# Patient Record
Sex: Female | Born: 1995 | Hispanic: Yes | Marital: Single | Smoking: Never smoker
Health system: Southern US, Community
[De-identification: ages and names within clinical notes are randomized; demographics above are authoritative.]

## PROBLEM LIST (undated history)

## (undated) ENCOUNTER — Ambulatory Visit: Payer: Self-pay | Source: Home / Self Care

## (undated) DIAGNOSIS — T7840XA Allergy, unspecified, initial encounter: Secondary | ICD-10-CM

## (undated) DIAGNOSIS — K37 Unspecified appendicitis: Secondary | ICD-10-CM

## (undated) DIAGNOSIS — R102 Pelvic and perineal pain unspecified side: Secondary | ICD-10-CM

## (undated) DIAGNOSIS — N809 Endometriosis, unspecified: Secondary | ICD-10-CM

## (undated) DIAGNOSIS — K219 Gastro-esophageal reflux disease without esophagitis: Secondary | ICD-10-CM

## (undated) DIAGNOSIS — K5900 Constipation, unspecified: Secondary | ICD-10-CM

## (undated) HISTORY — DX: Unspecified appendicitis: K37

## (undated) HISTORY — PX: APPENDECTOMY: SHX54

## (undated) HISTORY — DX: Allergy, unspecified, initial encounter: T78.40XA

## (undated) HISTORY — DX: Constipation, unspecified: K59.00

## (undated) HISTORY — DX: Endometriosis, unspecified: N80.9

## (undated) HISTORY — DX: Gastro-esophageal reflux disease without esophagitis: K21.9

## (undated) HISTORY — DX: Pelvic and perineal pain: R10.2

## (undated) HISTORY — DX: Pelvic and perineal pain unspecified side: R10.20

---
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
## 2014-07-06 ENCOUNTER — Ambulatory Visit: Payer: 59 | Admitting: Physician Assistant

## 2014-10-02 ENCOUNTER — Ambulatory Visit (INDEPENDENT_AMBULATORY_CARE_PROVIDER_SITE_OTHER): Payer: 59 | Admitting: Physician Assistant

## 2014-10-02 ENCOUNTER — Encounter: Payer: Self-pay | Admitting: Physician Assistant

## 2014-10-02 VITALS — BP 109/72 | HR 88 | Wt 135.0 lb

## 2014-10-02 DIAGNOSIS — R1084 Generalized abdominal pain: Secondary | ICD-10-CM

## 2014-10-02 DIAGNOSIS — N949 Unspecified condition associated with female genital organs and menstrual cycle: Secondary | ICD-10-CM | POA: Diagnosis not present

## 2014-10-02 DIAGNOSIS — R1032 Left lower quadrant pain: Secondary | ICD-10-CM

## 2014-10-02 DIAGNOSIS — R102 Pelvic and perineal pain: Secondary | ICD-10-CM | POA: Insufficient documentation

## 2014-10-02 DIAGNOSIS — K5900 Constipation, unspecified: Secondary | ICD-10-CM | POA: Diagnosis not present

## 2014-10-02 DIAGNOSIS — R198 Other specified symptoms and signs involving the digestive system and abdomen: Secondary | ICD-10-CM | POA: Insufficient documentation

## 2014-10-02 DIAGNOSIS — N898 Other specified noninflammatory disorders of vagina: Secondary | ICD-10-CM | POA: Diagnosis not present

## 2014-10-02 DIAGNOSIS — K5909 Other constipation: Secondary | ICD-10-CM

## 2014-10-02 DIAGNOSIS — Z7251 High risk heterosexual behavior: Secondary | ICD-10-CM

## 2014-10-02 LAB — CBC WITH DIFFERENTIAL/PLATELET
BASOS PCT: 0 % (ref 0–1)
Basophils Absolute: 0 10*3/uL (ref 0.0–0.1)
EOS ABS: 0.1 10*3/uL (ref 0.0–0.7)
Eosinophils Relative: 1 % (ref 0–5)
HCT: 39.4 % (ref 36.0–46.0)
Hemoglobin: 13.3 g/dL (ref 12.0–15.0)
Lymphocytes Relative: 23 % (ref 12–46)
Lymphs Abs: 3 10*3/uL (ref 0.7–4.0)
MCH: 29.4 pg (ref 26.0–34.0)
MCHC: 33.8 g/dL (ref 30.0–36.0)
MCV: 87.2 fL (ref 78.0–100.0)
MONO ABS: 0.8 10*3/uL (ref 0.1–1.0)
MONOS PCT: 6 % (ref 3–12)
MPV: 9.9 fL (ref 8.6–12.4)
NEUTROS ABS: 9.1 10*3/uL — AB (ref 1.7–7.7)
Neutrophils Relative %: 70 % (ref 43–77)
Platelets: 234 10*3/uL (ref 150–400)
RBC: 4.52 MIL/uL (ref 3.87–5.11)
RDW: 12.6 % (ref 11.5–15.5)
WBC: 13 10*3/uL — ABNORMAL HIGH (ref 4.0–10.5)

## 2014-10-02 LAB — LIPASE: Lipase: 32 U/L (ref 0–75)

## 2014-10-02 LAB — COMPLETE METABOLIC PANEL WITH GFR
ALBUMIN: 4.2 g/dL (ref 3.5–5.2)
ALK PHOS: 90 U/L (ref 39–117)
ALT: 16 U/L (ref 0–35)
AST: 17 U/L (ref 0–37)
BUN: 14 mg/dL (ref 6–23)
CO2: 24 mEq/L (ref 19–32)
Calcium: 9.4 mg/dL (ref 8.4–10.5)
Chloride: 105 mEq/L (ref 96–112)
Creat: 0.76 mg/dL (ref 0.50–1.10)
GFR, Est African American: >89 mL/min
Glucose, Bld: 105 mg/dL — ABNORMAL HIGH (ref 70–99)
POTASSIUM: 4 meq/L (ref 3.5–5.3)
Sodium: 138 mEq/L (ref 135–145)
Total Bilirubin: 0.8 mg/dL (ref 0.2–1.1)
Total Protein: 7.3 g/dL (ref 6.0–8.3)

## 2014-10-02 LAB — POCT URINALYSIS DIPSTICK
BILIRUBIN UA: NEGATIVE
Glucose, UA: NEGATIVE
Ketones, UA: NEGATIVE
Leukocytes, UA: NEGATIVE
Nitrite, UA: NEGATIVE
Protein, UA: NEGATIVE
RBC UA: NEGATIVE
SPEC GRAV UA: 1.02
Urobilinogen, UA: 0.2
pH, UA: 7

## 2014-10-02 LAB — WET PREP FOR TRICH, YEAST, CLUE
Trich, Wet Prep: NONE SEEN
Yeast Wet Prep HPF POC: NONE SEEN

## 2014-10-02 MED ORDER — AZITHROMYCIN 1 G PO PACK
1.0000 g | PACK | Freq: Once | ORAL | Status: AC
  Administered 2014-10-02: 1 g via ORAL

## 2014-10-02 MED ORDER — OMEPRAZOLE 40 MG PO CPDR: 40.0000 mg | DELAYED_RELEASE_CAPSULE | Freq: Every day | ORAL | Status: DC

## 2014-10-02 MED ORDER — POLYETHYLENE GLYCOL 3350 17 GM/SCOOP PO POWD: 17.0000 g | Freq: Two times a day (BID) | ORAL | Status: DC | PRN

## 2014-10-02 MED ORDER — LIDOCAINE-HYDROCORTISONE ACE 3-0.5 % RE CREA: 1.0000 | TOPICAL_CREAM | Freq: Two times a day (BID) | RECTAL | Status: DC

## 2014-10-02 MED ORDER — CEFTRIAXONE SODIUM 1 G IJ SOLR
1.0000 g | Freq: Once | INTRAMUSCULAR | Status: AC
  Administered 2014-10-02: 1 g via INTRAMUSCULAR

## 2014-10-02 NOTE — Patient Instructions (Signed)
Omeprazole daily.  Rectal cream for constipation.  Prescription miralax twice a day.  Get labs.

## 2014-10-02 NOTE — Progress Notes (Signed)
   Subjective:    Patient ID: Katie Anthony, female    DOB: [birth year], 19 y.o.   MRN: [id number]  HPI  Pt is a 19 yo female who presents to the clinic with 2 weeks of abdominal pain. In the last 3 days the pain has started to radiate to her left back. She has pain all over abdomin. She has been vomiting for last week. Mostly after eating. She does complain of some indigestion. She has hx of constipation. She has not had BM in last 4 days until she drank a fruit smoothie yesterday and had one hard bowel movement. She admits to pain with bowel movements. Last menstrual period 09/11/14. UPT was negative at home. Per pt only on sexual partner.    Review of Systems  All other systems reviewed and are negative.      Objective:   Physical Exam  Constitutional: She is oriented to person, place, and time. She appears well-developed and well-nourished.  HENT:  Head: Normocephalic and atraumatic.  Cardiovascular: Normal rate, regular rhythm and normal heart sounds.   Pulmonary/Chest: Effort normal and breath sounds normal.  Left sided CVA tenderness.   Abdominal: Soft. Bowel sounds are normal. She exhibits no distension.  Tenderness over entire abdomen. Worse over left and right lower quadrant. Some guarding bilaterally as well.  Mild tenderness over epigastric area and LUQ.   Genitourinary: Vaginal discharge found.  Cervix very erythematous with thick purulent discharge present.  Bilateral adenxal tenderness on exam.   Neurological: She is alert and oriented to person, place, and time.  Psychiatric: She has a normal mood and affect. Her behavior is normal.          Assessment & Plan:  Generalized abdominal pain/adnexal tenderness/left CVA tenderness/constipation/painful bowel movements- uncertain etiology. UA- negative for leuks, blood, nitrates. Will culture. No sign of UTI or stones.  Will treat for PID. After physical exam very suspcious. 1 gram rocephin IM, Azithromycin slurry. Sent labs  for GC/chlamydia and wet prep. Certainly constipation could cause as well. miralax bid for next week. Call if no bowel movement. anusol for rectal pain. Per pt some indgestion will start omeprazole. Will get serum pregnancy, cmp, cbc, lipase. Will consider imaging if not improving. Call if pain worsening.

## 2014-10-03 LAB — HCG, SERUM, QUALITATIVE: Preg, Serum: NEGATIVE

## 2014-10-04 LAB — URINE CULTURE
COLONY COUNT: NO GROWTH
Organism ID, Bacteria: NO GROWTH

## 2014-10-05 ENCOUNTER — Other Ambulatory Visit: Payer: Self-pay | Admitting: Physician Assistant

## 2014-10-05 MED ORDER — METRONIDAZOLE 500 MG PO TABS: 500.0000 mg | ORAL_TABLET | Freq: Two times a day (BID) | ORAL | Status: DC

## 2014-10-07 ENCOUNTER — Telehealth: Payer: Self-pay | Admitting: Physician Assistant

## 2014-10-07 DIAGNOSIS — Z7253 High risk bisexual behavior: Secondary | ICD-10-CM

## 2014-10-07 LAB — GC/CHLAMYDIA PROBE AMP
CT PROBE, AMP APTIMA: POSITIVE — AB
GC Probe RNA: NEGATIVE

## 2014-10-07 NOTE — Telephone Encounter (Signed)
Patient called to check on her std lab she had labs sone 10/02/14 and that one had not came back yet, but the others did. Thanks

## 2014-10-08 ENCOUNTER — Encounter: Payer: Self-pay | Admitting: Physician Assistant

## 2014-10-08 DIAGNOSIS — A749 Chlamydial infection, unspecified: Secondary | ICD-10-CM | POA: Insufficient documentation

## 2014-10-08 NOTE — Telephone Encounter (Signed)
Labs ordered and faxed to lab

## 2014-10-12 ENCOUNTER — Ambulatory Visit: Payer: 59 | Admitting: Physician Assistant

## 2014-10-13 ENCOUNTER — Ambulatory Visit (INDEPENDENT_AMBULATORY_CARE_PROVIDER_SITE_OTHER): Payer: 59 | Admitting: Physician Assistant

## 2014-10-13 VITALS — BP 110/82 | HR 84

## 2014-10-13 DIAGNOSIS — M545 Low back pain, unspecified: Secondary | ICD-10-CM

## 2014-10-13 DIAGNOSIS — R1031 Right lower quadrant pain: Secondary | ICD-10-CM | POA: Diagnosis not present

## 2014-10-13 DIAGNOSIS — Z8619 Personal history of other infectious and parasitic diseases: Secondary | ICD-10-CM | POA: Diagnosis not present

## 2014-10-13 DIAGNOSIS — R300 Dysuria: Secondary | ICD-10-CM | POA: Diagnosis not present

## 2014-10-13 LAB — POCT URINALYSIS DIPSTICK
BILIRUBIN UA: NEGATIVE
Blood, UA: NEGATIVE
GLUCOSE UA: NEGATIVE
Ketones, UA: NEGATIVE
LEUKOCYTES UA: NEGATIVE
NITRITE UA: NEGATIVE
Protein, UA: NEGATIVE
Spec Grav, UA: 1.01
Urobilinogen, UA: 0.2
pH, UA: 5.5

## 2014-10-13 MED ORDER — TRAMADOL HCL 50 MG PO TABS: 50.0000 mg | ORAL_TABLET | Freq: Three times a day (TID) | ORAL | Status: DC | PRN

## 2014-10-13 MED ORDER — DOXYCYCLINE HYCLATE 100 MG PO TABS: 100.0000 mg | ORAL_TABLET | Freq: Two times a day (BID) | ORAL | Status: DC

## 2014-10-13 MED ORDER — PROMETHAZINE HCL 12.5 MG PO TABS: 12.5000 mg | ORAL_TABLET | Freq: Three times a day (TID) | ORAL | Status: DC | PRN

## 2014-10-14 ENCOUNTER — Encounter: Payer: Self-pay | Admitting: Physician Assistant

## 2014-10-14 LAB — RPR

## 2014-10-14 LAB — HIV ANTIBODY (ROUTINE TESTING W REFLEX): HIV 1&2 Ab, 4th Generation: NONREACTIVE
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
## 2014-10-14 NOTE — Addendum Note (Signed)
Addended by: Jomarie LongsBREEBACK, Elnoria Livingston L on: 10/14/2014 05:46 PM   Modules accepted: Level of Service

## 2014-10-15 LAB — URINE CULTURE
Colony Count: NO GROWTH
ORGANISM ID, BACTERIA: NO GROWTH

## 2014-10-16 ENCOUNTER — Telehealth: Payer: Self-pay | Admitting: *Deleted

## 2014-10-16 NOTE — Telephone Encounter (Signed)
Left VM for patient concerning results.

## 2014-10-16 NOTE — Telephone Encounter (Signed)
-----   Message from Jomarie LongsJade L Breeback, [place] JerseyPA-C sent at 10/15/2014  7:59 PM EDT ----- Call pt: no growth on urine culture.

## 2014-10-20 ENCOUNTER — Other Ambulatory Visit: Payer: 59

## 2014-11-03 ENCOUNTER — Ambulatory Visit (INDEPENDENT_AMBULATORY_CARE_PROVIDER_SITE_OTHER): Payer: 59 | Admitting: Physician Assistant

## 2014-11-03 ENCOUNTER — Encounter: Payer: Self-pay | Admitting: Physician Assistant

## 2014-11-03 VITALS — BP 109/74 | HR 74 | Wt 138.0 lb

## 2014-11-03 DIAGNOSIS — Z8619 Personal history of other infectious and parasitic diseases: Secondary | ICD-10-CM | POA: Diagnosis not present

## 2014-11-03 DIAGNOSIS — Z30013 Encounter for initial prescription of injectable contraceptive: Secondary | ICD-10-CM

## 2014-11-03 LAB — POCT URINE PREGNANCY: Preg Test, Ur: NEGATIVE

## 2014-11-03 MED ORDER — MEDROXYPROGESTERONE ACETATE 150 MG/ML IM SUSP
150.0000 mg | Freq: Once | INTRAMUSCULAR | Status: AC
  Administered 2014-11-03: 150 mg via INTRAMUSCULAR

## 2014-11-04 LAB — GC/CHLAMYDIA PROBE AMP, URINE
CHLAMYDIA, SWAB/URINE, PCR: NEGATIVE
GC Probe Amp, Urine: NEGATIVE

## 2014-11-04 LAB — HIV ANTIBODY (ROUTINE TESTING W REFLEX)

## 2014-11-04 NOTE — Progress Notes (Signed)
   Subjective:    Patient ID: Katie Anthony, female    DOB: 1996/01/01, 19 y.o.   MRN: [id number]  HPI   Patient is a 19 year old female who presents to the clinic wanting to start birth control. She has never had a Pap smear. Her last menstrual period is currently in progress. She does have a history of chlamydia. We do not have a negative chlamydia to show resolution. She denies any abdominal pain or vaginal symptoms today. She has had the Implanon birth control in the past. She does not want to take pills.    Review of Systems  All other systems reviewed and are negative.      Objective:   Physical Exam  Constitutional: She is oriented to person, place, and time. She appears well-developed and well-nourished.  HENT:  Head: Normocephalic and atraumatic.  Cardiovascular: Normal rate, regular rhythm and normal heart sounds.   Pulmonary/Chest: Effort normal and breath sounds normal. She has no wheezes.  Neurological: She is alert and oriented to person, place, and time.  Psychiatric: She has a normal mood and affect. Her behavior is normal.          Assessment & Plan:  Need for contraception- negative UPT. Options of Versed control were discussed with patient. Pt is on cycle. Patient opted for Katie dose of Depo provera given in office today. Pt given window for return visit in approximately 3 months. Patient warned this does not protect her against STDs. Encourage use of condoms. Discussed potential side effects of Depo-Provera. Patient is aware.  History of chlamydial infection-patient did undergo complete treatment. Will test with urine today for resolution.  FYI we did notice today that patient has 2 charts one under Katie Anthony and one under Katie Anthony. I did alert Geneticist, molecularAmber Bender my nurse and she is calling to get charts merged. One chart shows chlamydia results.

## 2014-11-06 ENCOUNTER — Encounter: Payer: Self-pay | Admitting: Physician Assistant

## 2015-02-02 ENCOUNTER — Ambulatory Visit: Payer: 59

## 2015-05-20 ENCOUNTER — Ambulatory Visit: Payer: 59 | Admitting: Obstetrics & Gynecology

## 2015-05-20 DIAGNOSIS — N926 Irregular menstruation, unspecified: Secondary | ICD-10-CM

## 2017-04-03 ENCOUNTER — Encounter: Payer: Self-pay | Admitting: Physician Assistant

## 2017-04-03 ENCOUNTER — Ambulatory Visit (INDEPENDENT_AMBULATORY_CARE_PROVIDER_SITE_OTHER): Payer: 59 | Admitting: Physician Assistant

## 2017-04-03 VITALS — BP 103/70 | HR 87 | Wt 115.0 lb

## 2017-04-03 DIAGNOSIS — D649 Anemia, unspecified: Secondary | ICD-10-CM | POA: Diagnosis not present

## 2017-04-03 DIAGNOSIS — K600 Acute anal fissure: Secondary | ICD-10-CM | POA: Diagnosis not present

## 2017-04-03 DIAGNOSIS — R300 Dysuria: Secondary | ICD-10-CM

## 2017-04-03 DIAGNOSIS — K5909 Other constipation: Secondary | ICD-10-CM

## 2017-04-03 DIAGNOSIS — K625 Hemorrhage of anus and rectum: Secondary | ICD-10-CM

## 2017-04-03 MED ORDER — PHENAZOPYRIDINE HCL 200 MG PO TABS
200.0000 mg | ORAL_TABLET | Freq: Three times a day (TID) | ORAL | 0 refills | Status: DC | PRN
Start: 2017-04-03 — End: 2017-10-11

## 2017-04-03 MED ORDER — NITROFURANTOIN MONOHYD MACRO 100 MG PO CAPS: 100.0000 mg | ORAL_CAPSULE | Freq: Two times a day (BID) | ORAL | 0 refills | Status: AC

## 2017-04-03 MED ORDER — NITROGLYCERIN 0.4 % RE OINT: 1.0000 [in_us] | TOPICAL_OINTMENT | Freq: Two times a day (BID) | RECTAL | 0 refills | Status: DC | PRN

## 2017-04-03 MED ORDER — DOCUSATE SODIUM 250 MG PO CAPS
250.0000 mg | ORAL_CAPSULE | Freq: Every day | ORAL | 0 refills | Status: DC
Start: 2017-04-03 — End: 2017-10-11

## 2017-04-03 MED ORDER — LUBIPROSTONE 24 MCG PO CAPS: 24.0000 ug | ORAL_CAPSULE | Freq: Two times a day (BID) | ORAL | 1 refills | Status: DC

## 2017-04-03 NOTE — Progress Notes (Signed)
HPI:                                                                Katie Anthony is a 21 y.o. female who presents to [facility] today for blood in stool  Patient is Spanish speaking. Using family member as interpreter. She signed form to waive right to licensed interpreter.  Patient with PMH of chronic constipation, GERD, blood in stool presents today complaining of anorectal pain and bright red blood per rectum. Symptoms have been present for 4 days. Reports pain on her external anus while seated that is worse with defecation. Describes pain as severe. She does endorse constipation which is not relieved by Miralax and prune juice.   Additionally, patient state she accidentally inserted a rectal suppository in her vagina. She has had dysuria for 2 days and reports she can't pee.   She denies fevers, chills, abdominal pain, nausea or vomiting    Past Medical History:  Diagnosis Date  . Constipation   . Pelvic pain   . STI (sexually transmitted infection)    No past surgical history on file. Social History  Substance Use Topics  . Smoking status: Never Smoker  . Smokeless tobacco: Never Used  . Alcohol use No   family history includes Hyperlipidemia in her father; Hypertension in her father.  ROS: negative except as noted in the HPI  Medications: Current Outpatient Prescriptions  Medication Sig Dispense Refill  . norethindrone-ethinyl estradiol (JUNEL FE,GILDESS FE,LOESTRIN FE) 1-20 MG-MCG tablet Take by mouth.    . docusate sodium (COLACE) 250 MG capsule Take 1 capsule (250 mg total) by mouth daily. 10 capsule 0  . lubiprostone (AMITIZA) 24 MCG capsule Take 1 capsule (24 mcg total) by mouth 2 (two) times daily with a meal. 60 capsule 1  . nitrofurantoin, macrocrystal-monohydrate, (MACROBID) 100 MG capsule Take 1 capsule (100 mg total) by mouth 2 (two) times daily. 10 capsule 0  . Nitroglycerin 0.4 % OINT Place 1 inch  rectally every 12 (twelve) hours as needed (for pain, use up to 3 weeks.). 30 g 0  . phenazopyridine (PYRIDIUM) 200 MG tablet Take 1 tablet (200 mg total) by mouth 3 (three) times daily as needed for pain. 10 tablet 0   No current facility-administered medications for this visit.    No Known Allergies     Objective:  BP 103/70   Pulse 87   Wt 115 lb (52.2 kg)   BMI 21.20 kg/m  Gen:  alert, not ill-appearing, no distress, appropriate for age HEENT: head normocephalic without obvious abnormality, conjunctiva and cornea clear, trachea midline Pulm: Normal work of breathing, normal phonation GU: rectum with inflamed anterior sentinal skin tag at 6 o'clock, exquisitely tender, unable to tolerate DRE Neuro: alert and oriented x 3, no tremor MSK: extremities atraumatic, normal gait and station Skin: intact, no rashes on exposed skin, no jaundice, no cyanosis Psych: well-groomed, cooperative, good eye contact, euthymic mood, affect mood-congruent, speech is articulate, and thought processes clear and goal-directed  A chaperone was used for the GU portion of the exam, Olivia Mackie.  No results found for this or any previous visit (from the past 72 hour(s)). No results found.    Assessment and Plan: 21 y.o.  female with   1. Bright red blood per rectum - POC Hgb 12.4 - Ambulatory referral to Gastroenterology  2. Chronic constipation - patient has failed Miralax and OTC constipation medications. Recommend starting Amitiza - encouraged high fiber diet and stool softeners - lubiprostone (AMITIZA) 24 MCG capsule; Take 1 capsule (24 mcg total) by mouth 2 (two) times daily with a meal.  Dispense: 60 capsule; Refill: 1 - docusate sodium (COLACE) 250 MG capsule; Take 1 capsule (250 mg total) by mouth daily.  Dispense: 10 capsule; Refill: 0 - Ambulatory referral to Gastroenterology  3. Dysuria - unable to void to provide a specimen. Treating empirically with Macrobid - phenazopyridine  (PYRIDIUM) 200 MG tablet; Take 1 tablet (200 mg total) by mouth 3 (three) times daily as needed for pain.  Dispense: 10 tablet; Refill: 0 - nitrofurantoin, macrocrystal-monohydrate, (MACROBID) 100 MG capsule; Take 1 capsule (100 mg total) by mouth 2 (two) times daily.  Dispense: 10 capsule; Refill: 0  4. Acute anterior anal fissure - Nitroglycerin 0.4 % OINT; Place 1 inch rectally every 12 (twelve) hours as needed (for pain, use up to 3 weeks.).  Dispense: 30 g; Refill: 0 - docusate sodium (COLACE) 250 MG capsule; Take 1 capsule (250 mg total) by mouth daily.  Dispense: 10 capsule; Refill: 0  5. Anemia, unspecified type - POC Hgb 12.4  Patient education and anticipatory guidance given Patient agrees with treatment plan Follow-up as needed if symptoms worsen or fail to improve  Levonne Hubertharley E. Sharine Cadle PA-C

## 2017-04-03 NOTE — Patient Instructions (Addendum)
For urine: - Macrobid twice a day for 5 days to cover for urinary tract infection - Pyridium up to three times daily as needed for burning  For rectal pain: - Nitroglycerin cream apply 1 inch twice a day as needed for pain - Stool softener daily - Sitz baths  For constipation - Amitiza twice a day with meals   Fisura anal en los adultos (Anal Fissure, Adult) Una fisura anal es un pequeo desgarro o un corte en la piel que rodea el orificio del conducto anal (ano).El sangrado proveniente del desgarro o el corte suele detenerse solo despus de algunos minutos. Es probable que note Psychologist, educationalsangre en la materia fecal, hasta tanto el desgarro o el corte cicatricen. CUIDADOS EN EL HOGAR Comida y bebida  No consuma bananas ni productos lcteos, ya que estos alimentos pueden causar estreimiento.  Beba suficiente lquido para mantener el pis (orina) claro o de color amarillo plido.  Consuma gran cantidad de frutas, cereales integrales y verduras. Instrucciones generales  Mantenga la zona anal tan limpia y seca como sea posible.  Dese un bao de agua tibia (bao de asiento) como se lo haya indicado el mdico. No use jabn para limpiar la zona afectada.  Tome los medicamentos de venta libre y los recetados solamente como se lo haya indicado el mdico.  Use cremas o ungentos solamente como se lo haya indicado el mdico.  Concurra a todas las visitas de control como se lo haya indicado el mdico. Esto es importante. SOLICITE AYUDA SI:  Aumenta el sangrado.  Tiene fiebre.  Tiene heces acuosas (diarrea) mezcladas con sangre.  Siente dolor.  El problema [place]em[place]ora, en lugar de mejorar. Esta informacin no tiene Theme park managercomo fin reemplazar el consejo del mdico. Asegrese de hacerle al mdico cualquier pregunta que tenga. Document Released: 03/15/2011 Document Revised: 04/07/2015 Document Reviewed: 10/12/2014 Elsevier Interactive Patient Education  [employer]2018 Elsevier Inc.

## 2017-04-04 ENCOUNTER — Encounter: Payer: Self-pay | Admitting: Physician Assistant

## 2017-04-04 DIAGNOSIS — D649 Anemia, unspecified: Secondary | ICD-10-CM | POA: Insufficient documentation

## 2017-04-04 DIAGNOSIS — K600 Acute anal fissure: Secondary | ICD-10-CM | POA: Insufficient documentation

## 2017-04-04 LAB — POCT HEMOGLOBIN: Hemoglobin: 12.4 g/dL (ref 12.2–16.2)

## 2017-04-04 NOTE — Addendum Note (Signed)
Addended by: Thom ChimesHENRY, Kevyn Wengert M on: 04/04/2017 03:00 PM   Modules accepted: Orders

## 2017-04-05 ENCOUNTER — Encounter: Payer: Self-pay | Admitting: Gynecology

## 2017-04-05 ENCOUNTER — Ambulatory Visit (INDEPENDENT_AMBULATORY_CARE_PROVIDER_SITE_OTHER): Payer: 59 | Admitting: Gynecology

## 2017-04-05 VITALS — BP 110/66 | Ht 61.0 in | Wt 118.0 lb

## 2017-04-05 DIAGNOSIS — R102 Pelvic and perineal pain: Secondary | ICD-10-CM

## 2017-04-05 NOTE — Progress Notes (Signed)
    Katie Anthony 09-25-1995 [id number]        21 y.o.  G0P0000 presents for second opinion consult with an interpreter. History of always having "bad" periods since menarche, roughly age 21. Over the last year or so they have progressively gotten worse leading to significant abdominal pain to include ER evaluations. Pain much worse during her menses but also having intermenstrual pain. Some nausea and vomiting as well as constipation during this time.  Also having some dyspareunia. Has been evaluated multiple times [facility] Those records have been reviewed through care everywhere. She has had CT scans as well as ultrasounds this past year which showed several small ovarian cysts but on most recent end of August ultrasound these have cleared. No evidence of endometriomas or other significant pelvic pathology.  She does have a history of endometriosis in her mother.  Most recently saw Ernst BowlerJennifer Keiger, NP and she was started on oral contraceptives, took them for one month but said her pain did not get better. She historically sounds like she has been treated with Nexplanon and Depo-Provera in [place] neither of which helped with her pain.  Has appointment coming up to discuss surgery which sounds like a diagnostic laparoscopy through [facility]. Currently starting her menses now. Does not desire pregnancy trial at this point.  Past medical history,surgical history, problem list, medications, allergies, family history and social history were all reviewed and documented in the EPIC chart.  Directed ROS with pertinent positives and negatives documented in the history of present illness/assessment and plan.  Exam: Kennon PortelaKim Gardner assistant Vitals:   04/05/17 1411  BP: 110/66  Weight: 118 lb (53.5 kg)  Height: 5\' 1"  (1.549 m)   General appearance:  Normal Abdomen with diffuse general tenderness. No rebound, guarding, masses, organomegaly  Assessment/Plan:  21 y.o. G0P0000  with history of worsening menses and abdominal pain. Family history of endometriosis. Short trial of oral contraceptives 1 month. Past history of Depo-Provera/Nexplanon. Reviewed strong possibility of endometriosis given her clinical picture and family history. Other possibilities to include contributions from the GI tract discussed even that she does have some constipation, nausea and vomiting although this may be related to her endometriosis pain. Options for management include proceeding with diagnostic laparoscopy as planned through [facility] for a diagnostic as well as possible therapeutic intervention. Alternatives to include hormonal manipulation such as a longer trial of low-dose oral contraceptives or trial of Depo-Lupron both from a therapeutic as well as diagnostic standpoint. Just her only possibilities such as daily norethindrone acetate or retry of Depo-Provera.  Also reviewed possibility of Mirena IUD which would afford contraception as well as hopefully pain relief. The pros and cons of each choice were reviewed with the patient. At this point have recommended she follow up with her scheduled appointment to [facility] and follow up if she decides.  Greater than 50% of my time was spent in direct face to face counseling and coordination of care with the patient.     Dara LordsFONTAINE,Ferlin Fairhurst P MD, 3:09 PM 04/05/2017

## 2017-04-05 NOTE — Patient Instructions (Signed)
Follow up with [facility] for appointment as already arranged.

## 2017-10-10 DIAGNOSIS — F419 Anxiety disorder, unspecified: Secondary | ICD-10-CM | POA: Diagnosis present

## 2017-10-11 ENCOUNTER — Ambulatory Visit (HOSPITAL_BASED_OUTPATIENT_CLINIC_OR_DEPARTMENT_OTHER)
Admission: RE | Admit: 2017-10-11 | Discharge: 2017-10-11 | Disposition: A | Discharge status: Home / Self Care | Payer: 59 | Source: Ambulatory Visit | Attending: Urgent Care | Admitting: Urgent Care

## 2017-10-11 ENCOUNTER — Ambulatory Visit: Payer: 59 | Admitting: Urgent Care

## 2017-10-11 ENCOUNTER — Ambulatory Visit (HOSPITAL_COMMUNITY): Admission: RE | Admit: 2017-10-11 | Payer: 59 | Source: Ambulatory Visit

## 2017-10-11 ENCOUNTER — Encounter: Payer: Self-pay | Admitting: Urgent Care

## 2017-10-11 VITALS — BP 102/67 | HR 72 | Temp 98.7°F | Resp 18 | Ht 61.0 in | Wt 111.8 lb

## 2017-10-11 DIAGNOSIS — R112 Nausea with vomiting, unspecified: Secondary | ICD-10-CM

## 2017-10-11 DIAGNOSIS — R109 Unspecified abdominal pain: Secondary | ICD-10-CM

## 2017-10-11 DIAGNOSIS — R197 Diarrhea, unspecified: Secondary | ICD-10-CM

## 2017-10-11 DIAGNOSIS — T50905A Adverse effect of unspecified drugs, medicaments and biological substances, initial encounter: Secondary | ICD-10-CM

## 2017-10-11 DIAGNOSIS — Z8719 Personal history of other diseases of the digestive system: Secondary | ICD-10-CM

## 2017-10-11 DIAGNOSIS — N809 Endometriosis, unspecified: Secondary | ICD-10-CM

## 2017-10-11 LAB — POCT URINALYSIS DIP (MANUAL ENTRY)
BILIRUBIN UA: NEGATIVE
GLUCOSE UA: NEGATIVE mg/dL
Ketones, POC UA: NEGATIVE mg/dL
Leukocytes, UA: NEGATIVE
Nitrite, UA: NEGATIVE
Protein Ur, POC: NEGATIVE mg/dL
RBC UA: NEGATIVE
SPEC GRAV UA: 1.02 (ref 1.010–1.025)
Urobilinogen, UA: 0.2 E.U./dL
pH, UA: 6.5 (ref 5.0–8.0)

## 2017-10-11 LAB — POCT URINE PREGNANCY: PREG TEST UR: NEGATIVE

## 2017-10-11 MED ORDER — ONDANSETRON HCL 4 MG PO TABS
8.0000 mg | ORAL_TABLET | Freq: Once | ORAL | Status: AC
  Administered 2017-10-11: 8 mg via ORAL

## 2017-10-11 MED ORDER — IOPAMIDOL (ISOVUE-300) INJECTION 61%
100.0000 mL | Freq: Once | INTRAVENOUS | Status: AC | PRN
  Administered 2017-10-11: 100 mL via INTRAVENOUS

## 2017-10-11 MED ORDER — ONDANSETRON 8 MG PO TBDP: 8.0000 mg | ORAL_TABLET | Freq: Three times a day (TID) | ORAL | 0 refills | Status: DC | PRN

## 2017-10-11 MED ORDER — LOPERAMIDE HCL 2 MG PO CAPS: 2.0000 mg | ORAL_CAPSULE | Freq: Every day | ORAL | 0 refills | Status: DC | PRN

## 2017-10-11 NOTE — Progress Notes (Signed)
MRN: [id number] DOB: [birth year]  Subjective:   Katie Anthony is a 22 y.o. female presenting for 1 day history of nausea with vomiting (multiple episodes), diarrhea (all night), decreased appetite, chills, generalized abdominal pain. Symptoms started after taking new medication duloxetine. Had 2 surgeries at the end of November for appendicitis and endometriosis. She has since had nose bleeds. Denies headache, visual disturbances, confusion, chest pain, rashes, hematemesis, bloody stools, vaginal bleeding. Denies smoking cigarettes or drinking alcohol. Of note, patient was started on duloxetine yesterday, 10/10/2017, for management of depression. She had a normal cbc and tsh.  Tekeshia has a current medication list which includes the following prescription(s): duloxetine. She is allergic to latex.  [name]  has a past medical history of Appendicitis, Constipation, Endometriosis, and Pelvic pain. Also  has a past surgical history that includes Appendectomy. Her family history includes Diabetes in her father; Hyperlipidemia in her father; Hypertension in her father.   Objective:   Vitals: BP 102/67   Pulse 72   Temp 98.7 F (37.1 C) (Oral)   Resp 18   Ht 5\' 1"  (1.549 m)   Wt 111 lb 12.8 oz (50.7 kg)   SpO2 97%   BMI 21.12 kg/m   Physical Exam  Constitutional: She is oriented to person, place, and time. She appears well-developed and well-nourished.  HENT:  Mucous membranes dry.  Eyes: No scleral icterus.  Neck: Normal range of motion. Neck supple.  Cardiovascular: Normal rate, regular rhythm and intact distal pulses. Exam reveals no gallop and no friction rub.  No murmur heard. Pulmonary/Chest: No respiratory distress. She has no wheezes. She has no rales.  Abdominal: Soft. Bowel sounds are normal. She exhibits no distension and no mass. There is tenderness (exquisite, left-sided). There is guarding.  Musculoskeletal: She exhibits no edema.  Lymphadenopathy:    She has no  cervical adenopathy.  Neurological: She is alert and oriented to person, place, and time.  Skin: Skin is warm and dry.  Psychiatric: She has a normal mood and affect.   Results for orders placed or performed in visit on 10/11/17 (from the past 24 hour(s))  POCT urinalysis dipstick     Status: Abnormal   Collection Time: 10/11/17  2:15 PM  Result Value Ref Range   Color, UA orange (A) yellow   Clarity, UA clear clear   Glucose, UA negative negative mg/dL   Bilirubin, UA negative negative   Ketones, POC UA negative negative mg/dL   Spec Grav, UA 5.621 3.086 - 1.025   Blood, UA negative negative   pH, UA 6.5 5.0 - 8.0   Protein Ur, POC negative negative mg/dL   Urobilinogen, UA 0.2 0.2 or 1.0 E.U./dL   Nitrite, UA Negative Negative   Leukocytes, UA Negative Negative  POCT urine pregnancy     Status: Normal   Collection Time: 10/11/17  2:44 PM  Result Value Ref Range   Preg Test, Ur Negative Negative   Assessment and Plan :   Adverse effect of drug, initial encounter - Plan: POCT urinalysis dipstick, Comprehensive metabolic panel, CANCELED: POCT CBC  Nausea and vomiting, intractability of vomiting not specified, unspecified vomiting type - Plan: Urine Microscopic, POCT urine pregnancy, ondansetron (ZOFRAN) tablet 8 mg  Diarrhea, unspecified type  Acute abdominal pain - Plan: CT Abdomen Pelvis W Contrast  Left sided abdominal pain - Plan: CT Abdomen Pelvis W Contrast  History of appendicitis  Endometriosis  Will pursue stat CT given recent complex history including surgery for endometriosis  and appendicitis in 05/2017. Patient given IV fluids and Zofran in clinic. Discussed differential with patient and she in agreement with treatment plan.   Katie BambergMario Nycere Presley, PA-C Primary Care at [facility] [phone] 10/11/2017  2:00 PM

## 2017-10-11 NOTE — Patient Instructions (Addendum)
Por favor ve al hospital de [place]. Entra por el departamento de emergencias y diles que tienes una cita para un CT abdomen/pelvis. Por cual quier duda les puedes llamar [phone]    Dolor abdominal en adultos Abdominal Pain, Adult El dolor abdominal puede tener muchas causas. A menudo, no es grave y [place] sin tratamiento o con tratamiento en la casa. Sin embargo, a Facilities manager abdominal es intenso. El mdico revisar sus antecedentes mdicos y le har un examen fsico para tratar de Production assistant, radio causa del dolor abdominal. Siga estas instrucciones en su casa:  Tome los medicamentos de venta libre y los recetados solamente como se lo haya indicado el mdico. No tome un laxante a menos que se lo haya indicado el mdico.  Beba suficiente lquido para Pharmacologist la orina clara o de color amarillo plido.  Controle su afeccin para ver si hay cambios.  Concurra a todas las visitas de control como se lo haya indicado el mdico. Esto es importante. Comunquese con un mdico si:  El dolor abdominal cambia o empeora.  No tiene apetito o baja de peso sin proponrselo.  Est estreido o tiene diarrea durante ms de 2 o 3das.  Tiene dolor cuando orina o defeca.  El dolor abdominal lo despierta de noche.  El dolor empeora con las comidas, despus de comer o con determinados alimentos.  Tiene vmitos y no puede retener nada.  Tiene fiebre. Solicite ayuda de inmediato si:  El dolor no desaparece tan pronto como el mdico le dijo que era esperable.  No puede detener los vmitos.  El Engineer, mining se siente solo en zonas del abdomen, como el lado derecho o la parte inferior izquierda del abdomen.  Las heces son sanguinolentas o de color negro, o de aspecto alquitranado.  Tiene dolor intenso, clicos, o meteorismo en el abdomen.  Tiene signos de deshidratacin, por ejemplo: ? Larose [place]ells, muy escasa o falta de [place]. ? Labios agrietados. ? [employer]. ? Ojos  hundidos. ? Somnolencia. ? Debilidad. Esta informacin no tiene Theme park manager el consejo del mdico. Asegrese de hacerle al mdico cualquier pregunta que tenga. Document Released: 07/17/2005 Document Revised: 07/06/2016 Document Reviewed: 12/29/2015 Elsevier Interactive Patient Education  2018 [employer].     Diarrea en los adultos (Diarrhea, Adult) La diarrea ocurre cuando la materia fecal (heces) es frecuentemente blanda y [place]. La diarrea puede hacerlo sentir dbil y causarle deshidratacin. La deshidratacin puede hacerlo sentir cansado y sediento, producirle sequedad en la boca y disminuir la frecuencia con la que [place]. La diarrea suele durar 2 o 3das. Sin embargo, puede durar ms tiempo si se trata de un signo de algo ms serio. Es importante tratar la diarrea como se lo haya indicado el mdico. CUIDADOS EN EL [employer] Comida y bebida Siga estas recomendaciones como se lo haya indicado el mdico:  Tome una solucin de rehidratacin oral (SRO). Es Neomia Dear bebida que se vende en farmacias y tiendas.  Beba lquidos claros, por ejemplo: ? [place]. ? Cubitos de hielo. ? Jugo de frutas diluido. ? Bebidas deportivas de bajas caloras.  En la medida en que pueda, consuma alimentos blandos y fciles de digerir en pequeas cantidades. Ellos son: ? Bananas. ? Pur de [employer]. ? Arroz. ? Carnes bajas en grasa Nonah Mattes). ? Tostadas. ? Galletas.  Evite beber lquidos que contengan mucha azcar o cafena.  Evite el alcohol.  Evite los alimentos picantes o grasos. Instrucciones generales  Beba suficiente lquido para mantener el pis (orina) claro o  de color amarillo plido.  Lvese las manos con frecuencia. Use un desinfectante para manos si no dispone de [place] y [place].  Asegrese de que todas las personas que viven en su casa se laven bien las manos y con frecuencia.  Tome los medicamentos de venta libre y los recetados solamente como se lo haya indicado el mdico.  Descanse  en su casa hasta sentirse mejor.  Controle su afeccin para ver si hay cambios.  Tome un bao con agua tibia para Psychologist, educational ardor o dolor a causa de la diarrea.  Concurra a todas las visitas de control como se lo haya indicado el mdico. Esto es importante. SOLICITE AYUDA SI:  Tiene fiebre.  La diarrea empeora.  Aparecen nuevos sntomas.  No puede retener los lquidos.  Se siente mareado o siente que va a desvanecerse.  Tiene dolores de [place].  Tiene calambres musculares. SOLICITE AYUDA DE INMEDIATO SI:  Siente dolor en el pecho.  Se siente muy dbil o se desvanece (se desmaya).  Tiene heces con sangre, de color negro o con aspecto alquitranado.  Tiene dolor muy intenso en el vientre (abdomen), clicos o meteorismo.  Le cuesta respirar o respira muy rpidamente.  Su corazn late muy rpidamente.  Siente la piel fra y hmeda.  Se siente confundido.  Tiene signos de deshidratacin, por ejemplo: ? La [place] es [place], es muy escasa o no orina. ? Labios agrietados. ? [employer]. ? Ojos hundidos. ? Somnolencia. ? Debilidad. Esta informacin no tiene Theme park manager el consejo del mdico. Asegrese de hacerle al mdico cualquier pregunta que tenga. Document Released: 07/03/2012 Document Revised: 11/08/2015 Document Reviewed: 03/23/2015 Elsevier Interactive Patient Education  2018 [employer]. Vmitos, en adultos Vomiting, Adult Los vmitos se producen cuando el contenido del estmago se expulsa por la boca. Muchas personas sienten nuseas antes de vomitar. Los vmitos pueden hacerlo sentir dbil y causarle deshidratacin. La deshidratacin puede causarle cansancio, sed, sequedad en la boca y disminucin en la frecuencia con la que orina. Los [employer] y las personas que tienen otras enfermedades o un sistema inmunitario dbil estn en mayor riesgo de deshidratacin. Es importante tratar los vmitos como se lo haya indicado el mdico. Siga estas  indicaciones en su casa: Siga las instrucciones del mdico acerca de cmo cuidarse en el hogar. Qu debe comer y beber Siga estas recomendaciones como se lo haya indicado el mdico:  Tome una solucin de rehidratacin oral (oral rehydration solution, ORS). Esta es una bebida que se vende en farmacias y tiendas minoristas.  En la medida en que pueda, consuma alimentos blandos y fciles de digerir en pequeas cantidades. Estos alimentos incluyen bananas, compota de [place], arroz, carnes [place], tostadas y [address].  Beba lquidos claros en pequeas cantidades segn le sea posible. Los lquidos claros incluyen agua, cubitos de hielo, [place] deportivas bajas en caloras y [place] de fruta rebajado con agua (jugo de fruta diluido).  Evite beber lquidos que contengan mucha azcar o cafena.  Evite consumir alcohol y alimentos picantes o con alto contenido de [place].  Instrucciones generales   Lvese las manos frecuentemente con agua y [place]. Use desinfectante para manos si no dispone de [place] y [place]. Asegrese de que todos en el hogar se laven las manos con frecuencia.  Tome los medicamentos de venta libre y los recetados solamente como se lo haya indicado el mdico.  Controle su afeccin para Insurance risk surveyor cambio.  Concurra a todas las visitas de seguimiento como se lo haya indicado  el mdico. Esto es importante. Comunquese con un mdico si:  Tiene fiebre.  No puede retener los lquidos.  Los vmitos empeoran.  Aparecen nuevos sntomas.  Se siente mareado o siente que va a desvanecerse.  Tiene dolor de [place].  Tiene calambres musculares. Solicite ayuda de inmediato si:  [employer], el cuello, los brazos o la [place].  Se siente muy dbil o se desmaya.  Tiene vmitos persistentes.  Vomita y el vmito es de color rojo intenso o se asemeja al poso del caf.  Las Automatic Data o son de color negro, o tienen aspecto alquitranado.  Tiene  dolor intenso, clicos o distensin abdominal.  Siente dolor de cabeza intenso, rigidez en el cuello, o ambas cosas.  Tiene una erupcin cutnea.  Tiene problemas para respirar o respira muy rpidamente.  Su corazn late muy rpidamente.  Siente la piel fra y hmeda.  Se siente confundido.  Siente dolor al [employer].  Tiene signos de deshidratacin, como los siguientes: ? Orina de color oscuro, muy escasa o no orina. ? Labios agrietados. ? [employer]. ? Ojos hundidos. ? Somnolencia. ? Debilidad. Estos sntomas pueden representar un problema grave que constituye Radio broadcast assistant. No espere hasta que los sntomas desaparezcan. Solicite atencin mdica de inmediato. Comunquese con el servicio de emergencias de su localidad (911 en los [place]). No conduzca por sus propios medios [employer]. Esta informacin no tiene Theme park manager el consejo del mdico. Asegrese de hacerle al mdico cualquier pregunta que tenga. Document Released: 08/13/2015 Document Revised: 10/25/2016 Document Reviewed: 03/23/2015 Elsevier Interactive Patient Education  2018 [employer].    Nuseas y vmitos en los adultos Nausea and Vomiting, Adult La nusea es la sensacin de Dentist en el estmago o de la necesidad de Biochemist, clinical. Si empeora, puede provocar vmitos. Los vmitos se producen cuando el contenido del estmago se expulsa por la boca. Pueden hacerlo sentir dbil y causar deshidratacin. La deshidratacin puede hacerlo sentir cansado y sediento, producirle sequedad en la boca y disminuir la frecuencia con la que [place]. Los [employer] y las personas que tienen otras enfermedades o un sistema inmunitario dbil estn en mayor riesgo de deshidratacin. Es importante tratar los vmitos como se lo haya indicado el mdico. Siga estas instrucciones en su casa: Siga las indicaciones del mdico sobre cmo debe cuidarse en su casa. Comida y bebida Siga estas recomendaciones como se lo haya  indicado el mdico:  Tome una solucin de rehidratacin oral (SRO). Esta es una bebida que se vende en farmacias y tiendas.  Beba lquidos claros en pequeas cantidades segn le sea posible. Beba lquidos claros, como agua, cubitos de hielo, jugos de fruta rebajados con agua y bebidas deportivas bajas en caloras.  En la medida en que pueda, consuma alimentos blandos y fciles de digerir en pequeas cantidades. Estos alimentos incluyen bananas, compota de [place], arroz, carnes [place], tostadas y [place].  Evite consumir lquidos que contengan mucha azcar o cafena, como bebidas energticas, bebidas deportivas y refrescos.  Evite el alcohol.  Evite los alimentos picantes o grasos.  Instrucciones generales  Beba suficiente lquido para mantener la orina clara o de color amarillo plido.  Lvese las manos con frecuencia. Use desinfectante para manos si no dispone de [place] y [place].  Asegrese de que todas las personas que viven en su casa se laven bien las manos y con frecuencia.  Tome los medicamentos de venta libre y los recetados solamente como se lo haya indicado el mdico.  Descanse en su casa mientras se recupera.  Controle su afeccin para ver si hay cambios.  Cuando sienta nuseas, respire lenta y profundamente.  Concurra a todas las visitas de control como se lo haya indicado el mdico. Esto es importante. Comunquese con un mdico si:  Tiene fiebre.  No puede retener los lquidos.  Los sntomas empeoran.  Aparecen nuevos sntomas.  Las nuseas no desaparecen despus de [address].  Se siente mareado o siente que va a desvanecerse.  Tiene dolores de [place].  Tiene calambres musculares. Solicite ayuda de inmediato si:  L-3 CommunicationsSiente dolor en el pecho, el cuello, los brazos o la [place].  Se siente muy dbil o se desmaya.  Tiene vmitos persistentes.  Observa sangre en el vmito.  El vmito se asemeja al poso del caf.  Tiene heces con sangre, de color negro,  o con aspecto alquitranado.  Siente un dolor de cabeza intenso, rigidez en el cuello, o ambas cosas.  Tiene una erupcin cutnea.  Tiene dolor intenso, clicos, o meteorismo en el abdomen.  Le cuesta respirar o respira muy rpidamente.  Su corazn late muy rpidamente.  Siente la piel fra y hmeda.  Se siente confundido.  Siente dolor al ConocoPhillipsorinar.  Tiene signos de deshidratacin, por ejemplo: ? Larose KellsOrina oscura, muy escasa o falta de [place]. ? Labios agrietados. ? M.D.C. HoldingsBoca seca. ? Ojos hundidos. ? Somnolencia. ? Debilidad. Estos sntomas pueden representar un problema grave que constituye Radio broadcast assistantuna emergencia. No espere hasta que los sntomas desaparezcan. Solicite atencin mdica de inmediato. Comunquese con el servicio de emergencias de su localidad (911 en los [place]). No conduzca por sus propios medios OfficeMax Incorporatedhasta el hospital. Esta informacin no tiene Theme park managercomo fin reemplazar el consejo del mdico. Asegrese de hacerle al mdico cualquier pregunta que tenga. Document Released: 08/06/2007 Document Revised: 10/20/2016 Document Reviewed: 03/23/2015 Elsevier Interactive Patient Education  2018 [employer].   IF you received an x-ray today, you will receive an invoice from [facility]. Please contact [facility] at [phone] with questions or concerns regarding your invoice.   IF you received labwork today, you will receive an invoice from [facility]. Please contact [facility] at [phone] with questions or concerns regarding your invoice.   Our billing staff will not be able to assist you with questions regarding bills from these companies.  You will be contacted with the lab results as soon as they are available. The fastest way to get your results is to activate your My Chart account. Instructions are located on the last page of this paperwork. If you have not heard from us regarding the results in 2 weeks, please contact this office.

## 2017-10-12 LAB — URINALYSIS, MICROSCOPIC ONLY: CASTS: NONE SEEN /LPF

## 2017-10-12 LAB — COMPREHENSIVE METABOLIC PANEL
ALK PHOS: 71 IU/L (ref 39–117)
ALT: 22 IU/L (ref 0–32)
AST: 24 IU/L (ref 0–40)
Albumin/Globulin Ratio: 1.4 (ref 1.2–2.2)
Albumin: 4.9 g/dL (ref 3.5–5.5)
BILIRUBIN TOTAL: 1.8 mg/dL — AB (ref 0.0–1.2)
BUN/Creatinine Ratio: 17 (ref 9–23)
BUN: 11 mg/dL (ref 6–20)
CHLORIDE: 100 mmol/L (ref 96–106)
CO2: 23 mmol/L (ref 20–29)
CREATININE: 0.66 mg/dL (ref 0.57–1.00)
Calcium: 9.8 mg/dL (ref 8.7–10.2)
GFR calc Af Amer: 145 mL/min/{1.73_m2} (ref >59)
GFR calc non Af Amer: 126 mL/min/{1.73_m2} (ref >59)
Globulin, Total: 3.5 g/dL (ref 1.5–4.5)
Glucose: 79 mg/dL (ref 65–99)
Potassium: 4.4 mmol/L (ref 3.5–5.2)
Sodium: 137 mmol/L (ref 134–144)
Total Protein: 8.4 g/dL (ref 6.0–8.5)

## 2017-10-12 LAB — CBC
Hematocrit: 41.4 % (ref 34.0–46.6)
Hemoglobin: 13.5 g/dL (ref 11.1–15.9)
MCH: 29 pg (ref 26.6–33.0)
MCHC: 32.6 g/dL (ref 31.5–35.7)
MCV: 89 fL (ref 79–97)
Platelets: 248 10*3/uL (ref 150–379)
RBC: 4.66 x10E6/uL (ref 3.77–5.28)
RDW: 13 % (ref 12.3–15.4)
WBC: 8.4 10*3/uL (ref 3.4–10.8)

## 2017-10-12 LAB — LIPASE: LIPASE: 41 U/L (ref 14–72)

## 2017-10-31 DIAGNOSIS — R040 Epistaxis: Secondary | ICD-10-CM | POA: Insufficient documentation

## 2018-07-21 ENCOUNTER — Encounter (HOSPITAL_COMMUNITY): Payer: Self-pay

## 2018-07-21 ENCOUNTER — Emergency Department (HOSPITAL_COMMUNITY): Payer: No Typology Code available for payment source

## 2018-07-21 ENCOUNTER — Emergency Department (HOSPITAL_COMMUNITY)
Admission: EM | Admit: 2018-07-21 | Discharge: 2018-07-21 | Disposition: A | Discharge status: Home / Self Care | Payer: No Typology Code available for payment source | Attending: Emergency Medicine | Admitting: Emergency Medicine

## 2018-07-21 ENCOUNTER — Other Ambulatory Visit: Payer: Self-pay

## 2018-07-21 DIAGNOSIS — Z91040 Latex allergy status: Secondary | ICD-10-CM | POA: Insufficient documentation

## 2018-07-21 DIAGNOSIS — S0990XA Unspecified injury of head, initial encounter: Secondary | ICD-10-CM | POA: Diagnosis present

## 2018-07-21 DIAGNOSIS — R5383 Other fatigue: Secondary | ICD-10-CM | POA: Diagnosis not present

## 2018-07-21 DIAGNOSIS — M25522 Pain in left elbow: Secondary | ICD-10-CM | POA: Insufficient documentation

## 2018-07-21 DIAGNOSIS — Y999 Unspecified external cause status: Secondary | ICD-10-CM | POA: Insufficient documentation

## 2018-07-21 DIAGNOSIS — Y9389 Activity, other specified: Secondary | ICD-10-CM | POA: Diagnosis not present

## 2018-07-21 DIAGNOSIS — S5012XA Contusion of left forearm, initial encounter: Secondary | ICD-10-CM | POA: Insufficient documentation

## 2018-07-21 DIAGNOSIS — S0003XA Contusion of scalp, initial encounter: Secondary | ICD-10-CM | POA: Insufficient documentation

## 2018-07-21 DIAGNOSIS — Y92410 Unspecified street and highway as the place of occurrence of the external cause: Secondary | ICD-10-CM | POA: Diagnosis not present

## 2018-07-21 MED ORDER — HYDROCODONE-ACETAMINOPHEN 5-325 MG PO TABS
1.0000 | ORAL_TABLET | Freq: Once | ORAL | Status: AC
  Administered 2018-07-21: 1 via ORAL
  Filled 2018-07-21: qty 1

## 2018-07-21 MED ORDER — ONDANSETRON 4 MG PO TBDP: 4.0000 mg | ORAL_TABLET | Freq: Three times a day (TID) | ORAL | 0 refills | Status: DC | PRN

## 2018-07-21 MED ORDER — IBUPROFEN 600 MG PO TABS: 600.0000 mg | ORAL_TABLET | Freq: Three times a day (TID) | ORAL | 0 refills | Status: DC | PRN
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
## 2018-07-21 NOTE — Discharge Instructions (Addendum)
Drink plenty of fluid  Do not use the sling for more than 2-3 days, to prevent frozen shoulder

## 2018-07-22 ENCOUNTER — Encounter: Payer: Self-pay | Admitting: Urgent Care

## 2018-10-03 ENCOUNTER — Encounter (HOSPITAL_COMMUNITY): Payer: Self-pay | Admitting: Emergency Medicine

## 2018-10-03 ENCOUNTER — Emergency Department (HOSPITAL_COMMUNITY): Payer: Medicaid Other

## 2018-10-03 ENCOUNTER — Other Ambulatory Visit: Payer: Self-pay

## 2018-10-03 ENCOUNTER — Emergency Department (HOSPITAL_COMMUNITY)
Admission: EM | Admit: 2018-10-03 | Discharge: 2018-10-03 | Disposition: A | Discharge status: Home / Self Care | Payer: Medicaid Other | Attending: Emergency Medicine | Admitting: Emergency Medicine

## 2018-10-03 DIAGNOSIS — Z3A08 8 weeks gestation of pregnancy: Secondary | ICD-10-CM | POA: Diagnosis not present

## 2018-10-03 DIAGNOSIS — O219 Vomiting of pregnancy, unspecified: Secondary | ICD-10-CM | POA: Insufficient documentation

## 2018-10-03 DIAGNOSIS — R202 Paresthesia of skin: Secondary | ICD-10-CM | POA: Diagnosis not present

## 2018-10-03 DIAGNOSIS — M5440 Lumbago with sciatica, unspecified side: Secondary | ICD-10-CM | POA: Insufficient documentation

## 2018-10-03 DIAGNOSIS — R112 Nausea with vomiting, unspecified: Secondary | ICD-10-CM

## 2018-10-03 DIAGNOSIS — O9989 Other specified diseases and conditions complicating pregnancy, childbirth and the puerperium: Secondary | ICD-10-CM | POA: Diagnosis present

## 2018-10-03 DIAGNOSIS — R197 Diarrhea, unspecified: Secondary | ICD-10-CM

## 2018-10-03 LAB — CBC WITH DIFFERENTIAL/PLATELET
Abs Immature Granulocytes: 0.06 10*3/uL (ref 0.00–0.07)
Basophils Absolute: 0 10*3/uL (ref 0.0–0.1)
Basophils Relative: 0 %
Eosinophils Absolute: 0 10*3/uL (ref 0.0–0.5)
Eosinophils Relative: 0 %
HCT: 37.7 % (ref 36.0–46.0)
Hemoglobin: 12.4 g/dL (ref 12.0–15.0)
Immature Granulocytes: 1 %
Lymphocytes Relative: 5 %
Lymphs Abs: 0.5 10*3/uL — ABNORMAL LOW (ref 0.7–4.0)
MCH: 30 pg (ref 26.0–34.0)
MCHC: 32.9 g/dL (ref 30.0–36.0)
MCV: 91.3 fL (ref 80.0–100.0)
Monocytes Absolute: 1.1 10*3/uL — ABNORMAL HIGH (ref 0.1–1.0)
Monocytes Relative: 11 %
NEUTROS PCT: 83 %
NRBC: 0 % (ref 0.0–0.2)
Neutro Abs: 8.3 10*3/uL — ABNORMAL HIGH (ref 1.7–7.7)
Platelets: 200 10*3/uL (ref 150–400)
RBC: 4.13 MIL/uL (ref 3.87–5.11)
RDW: 12.5 % (ref 11.5–15.5)
WBC: 10 10*3/uL (ref 4.0–10.5)

## 2018-10-03 LAB — COMPREHENSIVE METABOLIC PANEL
ALT: 50 U/L — ABNORMAL HIGH (ref 0–44)
AST: 35 U/L (ref 15–41)
Albumin: 4.2 g/dL (ref 3.5–5.0)
Alkaline Phosphatase: 47 U/L (ref 38–126)
Anion gap: 7 (ref 5–15)
BUN: <5 mg/dL — ABNORMAL LOW (ref 6–20)
CO2: 21 mmol/L — AB (ref 22–32)
Calcium: 8.9 mg/dL (ref 8.9–10.3)
Chloride: 105 mmol/L (ref 98–111)
Creatinine, Ser: 0.46 mg/dL (ref 0.44–1.00)
GFR calc Af Amer: >60 mL/min (ref >60)
GFR calc non Af Amer: >60 mL/min (ref >60)
Glucose, Bld: 87 mg/dL (ref 70–99)
Potassium: 3.3 mmol/L — ABNORMAL LOW (ref 3.5–5.1)
SODIUM: 133 mmol/L — AB (ref 135–145)
Total Bilirubin: 0.8 mg/dL (ref 0.3–1.2)
Total Protein: 7.7 g/dL (ref 6.5–8.1)

## 2018-10-03 LAB — URINALYSIS, ROUTINE W REFLEX MICROSCOPIC
BILIRUBIN URINE: NEGATIVE
Glucose, UA: NEGATIVE mg/dL
HGB URINE DIPSTICK: NEGATIVE
KETONES UR: NEGATIVE mg/dL
Leukocytes,Ua: NEGATIVE
Nitrite: NEGATIVE
PROTEIN: NEGATIVE mg/dL
Specific Gravity, Urine: 1.01 (ref 1.005–1.030)
pH: 8 (ref 5.0–8.0)

## 2018-10-03 LAB — LIPASE, BLOOD: Lipase: 34 U/L (ref 11–51)

## 2018-10-03 MED ORDER — ONDANSETRON HCL 4 MG/2ML IJ SOLN
4.0000 mg | Freq: Once | INTRAMUSCULAR | Status: AC
  Administered 2018-10-03: 4 mg via INTRAVENOUS
  Filled 2018-10-03: qty 2

## 2018-10-03 MED ORDER — MORPHINE SULFATE (PF) 4 MG/ML IV SOLN
4.0000 mg | Freq: Once | INTRAVENOUS | Status: AC
  Administered 2018-10-03: 4 mg via INTRAVENOUS
  Filled 2018-10-03: qty 1

## 2018-10-03 MED ORDER — HYDROCODONE-ACETAMINOPHEN 5-325 MG PO TABS: 2.0000 | ORAL_TABLET | ORAL | 0 refills | Status: DC | PRN

## 2018-10-03 MED ORDER — ACETAMINOPHEN 500 MG PO TABS
1000.0000 mg | ORAL_TABLET | Freq: Once | ORAL | Status: AC
  Administered 2018-10-03: 1000 mg via ORAL
  Filled 2018-10-03: qty 2

## 2018-10-03 MED ORDER — SODIUM CHLORIDE 0.9 % IV BOLUS
1000.0000 mL | Freq: Once | INTRAVENOUS | Status: AC
  Administered 2018-10-03: 1000 mL via INTRAVENOUS

## 2018-10-03 NOTE — Discharge Instructions (Signed)
SEEK IMMEDIATE MEDICAL ATTENTION IF: New numbness, tingling, weakness, or problem with the use of your arms or legs.  Severe back pain not relieved with medications.  Change in bowel or bladder control.  Increasing pain in any areas of the body (such as chest or abdominal pain).  Shortness of breath, dizziness or fainting.  Nausea (feeling sick to your stomach), vomiting, fever, or sweats.  

## 2018-10-03 NOTE — ED Provider Notes (Signed)
[facility]-EMERGENCY DEPT Provider Note   CSN: [id number] Arrival date & time: 10/03/18  1191    History   Chief Complaint Chief Complaint  Patient presents with  . Back Pain  . Headache    HPI Katie Anthony is a [id number] 23 y.o. female who presents with cc of n/v/d and leg/back pain. There is a language barrier and interpreter is utilized. The history is gathered form the patient, EMR, And Boyfriend who is at bedside. She has a pmh of appendectomy and endometriosis. The patient is [redacted] wks pregnant.  She has had some mild persistent nausea and occasional vomiting throughout her pregnancy.  Yesterday at noon the patient had onset of multiple episodes of diarrhea, nausea and vomiting uncharacteristic for her regular pregnancy nausea.  She has had no recent foreign travel, ingestion of suspicious foods.  She denies abdominal pain but does endorse back pain.  She states that at around 6 PM when she and her boyfriend were up at dinner she had sudden onset of severe bilateral paresthesia and severe pain rating down the back of both legs.  Nothing seems to make this worse or better.  It is constant.  She denies weakness.  She states that she also has some paresthesia in her upper extremities.  Since that time she has had severe pain.  She endorses some dysuria but denies urgency or frequency.  She has never had anything like this before.  She is not had any recent immunizations or other significant illnesses.  The patient has an associated moderate headache and feels lightheaded/presyncopal with standing.     HPI  Past Medical History:  Diagnosis Date  . Appendicitis   . Constipation   . Endometriosis   . Pelvic pain     Patient Active Problem List   Diagnosis Date Noted  . Acute anterior anal fissure 04/04/2017  . Anemia 04/04/2017  . Chlamydia infection 10/08/2014  . Chronic constipation 10/02/2014  . Adnexal tenderness 10/02/2014  . Left lower quadrant pain  10/02/2014  . Pain with bowel movements 10/02/2014  . Generalized abdominal pain 10/02/2014  . Straining during bowel movements 09/10/2013  . Constipated 09/09/2013  . Bulimia nervosa 09/09/2013  . Blood in stool 09/09/2013  . GERD (gastroesophageal reflux disease) 09/09/2013  . Unspecified constipation 09/09/2013    Past Surgical History:  Procedure Laterality Date  . APPENDECTOMY       OB History    Gravida  1   Para      Term      Preterm      AB      Living        SAB      TAB      Ectopic      Multiple      Live Births               Home Medications    Prior to Admission medications   Medication Sig Start Date End Date Taking? Authorizing Provider  ibuprofen (ADVIL,MOTRIN) 600 MG tablet Take 1 tablet (600 mg total) by mouth every 8 (eight) hours as needed for moderate pain. 07/21/18   Shaune Pollack, MD  loperamide (IMODIUM) 2 MG capsule Take 1 capsule (2 mg total) by mouth daily as needed for diarrhea or loose stools. 10/11/17   Wallis Bamberg, PA-C  ondansetron (ZOFRAN ODT) 4 MG disintegrating tablet Take 1 tablet (4 mg total) by mouth every 8 (eight) hours as needed for nausea or vomiting. 07/21/18  Shaune Pollack, MD  ondansetron (ZOFRAN-ODT) 8 MG disintegrating tablet Take 1 tablet (8 mg total) by mouth every 8 (eight) hours as needed for nausea. 10/11/17   Wallis Bamberg, PA-C    Family History Family History  Problem Relation Age of Onset  . Hyperlipidemia Father   . Hypertension Father   . Diabetes Father     Social History Social History   Tobacco Use  . Smoking status: Never Smoker  . Smokeless tobacco: Never Used  Substance Use Topics  . Alcohol use: No  . Drug use: No     Allergies   Latex and Latex   Review of Systems Review of Systems  Ten systems reviewed and are negative for acute change, except as noted in the HPI.   Physical Exam Updated Vital Signs BP 122/83   Pulse (!) 110   Temp 98.5 F (36.9 C) (Oral)    Resp 16   Ht 5\' 2"  (1.575 m)   Wt 59 kg   LMP 06/30/2018   SpO2 99%   BMI 23.78 kg/m   Physical Exam Vitals signs and nursing note reviewed.  Constitutional:      General: She is not in acute distress.    Appearance: She is well-developed. She is not diaphoretic.  HENT:     Head: Normocephalic and atraumatic.  Eyes:     General: No scleral icterus.    Conjunctiva/sclera: Conjunctivae normal.     Pupils: Pupils are equal, round, and reactive to light.     Comments: No horizontal, vertical or rotational nystagmus  Neck:     Musculoskeletal: Normal range of motion and neck supple.     Comments: Full active and passive ROM without pain No midline or paraspinal tenderness No nuchal rigidity or meningeal signs Cardiovascular:     Rate and Rhythm: Normal rate and regular rhythm.  Pulmonary:     Effort: Pulmonary effort is normal. No respiratory distress.     Breath sounds: Normal breath sounds. No wheezing or rales.  Abdominal:     General: Bowel sounds are normal.     Palpations: Abdomen is soft.     Tenderness: There is no abdominal tenderness. There is no right CVA tenderness, left CVA tenderness, guarding or rebound.  Musculoskeletal: Normal range of motion.     Comments: No midline spinal tenderness Positive straight leg test bilaterally.  This causes pain in the lower lumbar region  Lymphadenopathy:     Cervical: No cervical adenopathy.  Skin:    General: Skin is warm and dry.     Findings: No rash.  Neurological:     Mental Status: She is alert and oriented to person, place, and time.     Cranial Nerves: No cranial nerve deficit.     Sensory: Sensory deficit present.     Motor: Weakness present. No abnormal muscle tone.     Coordination: Coordination normal.     Deep Tendon Reflexes: Reflexes are normal and symmetric. Reflexes normal.     Comments: Mental Status:  Alert, oriented, thought content appropriate. Speech fluent without evidence of aphasia. Able to follow  2 step commands without difficulty.  Cranial Nerves:  II:  Peripheral visual fields grossly normal, pupils equal, round, reactive to light III,IV, VI: ptosis not present, extra-ocular motions intact bilaterally  V,VII: smile symmetric, facial light touch sensation equal VIII: hearing grossly normal bilaterally  IX,X: midline uvula rise  XI: bilateral shoulder shrug equal and strong XII: midline tongue extension  Motor:  5/5  in upper extremities and R leg. 4/5 strength with dorsi and plantar flexion of the left ankle Sensory:abnormal sensation to light touch BL posterior legs. Deep Tendon Reflexes: 2+ and symmetric  Cerebellar: normal finger-to-nose with bilateral upper extremities Gait: deffered CV: distal pulses palpable throughout   Psychiatric:        Behavior: Behavior normal.        Thought Content: Thought content normal.        Judgment: Judgment normal.      ED Treatments / Results  Labs (all labs ordered are listed, but only abnormal results are displayed) Labs Reviewed  URINALYSIS, ROUTINE W REFLEX MICROSCOPIC  CBC WITH DIFFERENTIAL/PLATELET  COMPREHENSIVE METABOLIC PANEL  LIPASE, BLOOD    EKG None  Radiology No results found.  Procedures Procedures (including critical care time)  Medications Ordered in ED Medications  ondansetron (ZOFRAN) injection 4 mg (has no administration in time range)  sodium chloride 0.9 % bolus 1,000 mL (has no administration in time range)  acetaminophen (TYLENOL) tablet 1,000 mg (has no administration in time range)     Initial Impression / Assessment and Plan / ED Course  I have reviewed the triage vital signs and the nursing notes.  Pertinent labs & imaging results that were available during my care of the patient were reviewed by me and considered in my medical decision making (see chart for details).        23 year old female with 8 weeks of pregnancy.  Came in today with nausea vomiting diarrhea, abnormal  bilateral lower extremity paresthesia.  Differential includes somatic sensation, electrolyte abnormality, Guyon Barr, MS, sciatica.  Patient has had no nausea vomiting or diarrhea here in the emergency department.  We obtained an MRI which showed some mild early L5-S1 degenerative changes without any acute other normalities.  Recommendations are against contrasted imaging during early pregnancy.  Patient is advised to follow closely with her PCP and neurology if her symptoms do not improve.  Review of labs shows no elevated white blood cell count, urinalysis is negative for acute infection.  Mild hyponatremia and hypokalemia likely secondary to the fact she has been vomiting.  Appears medically clear and reasonably screened for all acute emergent medical conditions at this time.  She is advised to follow-up outpatient in the next week. Final Clinical Impressions(s) / ED Diagnoses   Final diagnoses:  None    ED Discharge Orders    None       Arthor Captain, PA-C 10/03/18 1647    Raeford Razor, MD 10/04/18 1041

## 2018-10-03 NOTE — ED Triage Notes (Signed)
Patient is [redacted] weeks pregnant and complaining of back pain that is going down her legs that started yesterday. She is also having migraines.

## 2018-10-29 DIAGNOSIS — R87612 Low grade squamous intraepithelial lesion on cytologic smear of cervix (LGSIL): Secondary | ICD-10-CM | POA: Insufficient documentation

## 2019-04-21 ENCOUNTER — Encounter: Payer: Self-pay | Admitting: Gynecology

## 2020-02-16 ENCOUNTER — Emergency Department (HOSPITAL_COMMUNITY)
Admission: EM | Admit: 2020-02-16 | Discharge: 2020-02-16 | Discharge status: Against Medical Advice | Payer: Medicaid Other

## 2020-02-18 ENCOUNTER — Encounter (HOSPITAL_COMMUNITY): Payer: Self-pay | Admitting: Emergency Medicine

## 2020-02-18 ENCOUNTER — Inpatient Hospital Stay (HOSPITAL_COMMUNITY)
Admission: EM | Admit: 2020-02-18 | Discharge: 2020-02-20 | DRG: 419 | Disposition: A | Discharge status: Home / Self Care | Payer: Medicaid Other | Attending: Surgery | Admitting: Surgery

## 2020-02-18 DIAGNOSIS — K8000 Calculus of gallbladder with acute cholecystitis without obstruction: Secondary | ICD-10-CM | POA: Diagnosis present

## 2020-02-18 DIAGNOSIS — R1011 Right upper quadrant pain: Secondary | ICD-10-CM | POA: Diagnosis present

## 2020-02-18 DIAGNOSIS — R12 Heartburn: Secondary | ICD-10-CM

## 2020-02-18 DIAGNOSIS — Z419 Encounter for procedure for purposes other than remedying health state, unspecified: Secondary | ICD-10-CM

## 2020-02-18 DIAGNOSIS — Z79899 Other long term (current) drug therapy: Secondary | ICD-10-CM | POA: Diagnosis not present

## 2020-02-18 DIAGNOSIS — Z91040 Latex allergy status: Secondary | ICD-10-CM

## 2020-02-18 DIAGNOSIS — F419 Anxiety disorder, unspecified: Secondary | ICD-10-CM | POA: Diagnosis present

## 2020-02-18 DIAGNOSIS — Z8616 Personal history of COVID-19: Secondary | ICD-10-CM

## 2020-02-18 DIAGNOSIS — Z83438 Family history of other disorder of lipoprotein metabolism and other lipidemia: Secondary | ICD-10-CM

## 2020-02-18 DIAGNOSIS — F329 Major depressive disorder, single episode, unspecified: Secondary | ICD-10-CM | POA: Diagnosis present

## 2020-02-18 DIAGNOSIS — K219 Gastro-esophageal reflux disease without esophagitis: Secondary | ICD-10-CM | POA: Diagnosis present

## 2020-02-18 DIAGNOSIS — Z885 Allergy status to narcotic agent status: Secondary | ICD-10-CM

## 2020-02-18 DIAGNOSIS — Z8249 Family history of ischemic heart disease and other diseases of the circulatory system: Secondary | ICD-10-CM | POA: Diagnosis not present

## 2020-02-18 DIAGNOSIS — K812 Acute cholecystitis with chronic cholecystitis: Secondary | ICD-10-CM

## 2020-02-18 DIAGNOSIS — Z789 Other specified health status: Secondary | ICD-10-CM

## 2020-02-18 LAB — COMPREHENSIVE METABOLIC PANEL
ALT: 39 U/L (ref 0–44)
AST: 20 U/L (ref 15–41)
Albumin: 4.5 g/dL (ref 3.5–5.0)
Alkaline Phosphatase: 74 U/L (ref 38–126)
Anion gap: 11 (ref 5–15)
BUN: 9 mg/dL (ref 6–20)
CO2: 25 mmol/L (ref 22–32)
Calcium: 9.2 mg/dL (ref 8.9–10.3)
Chloride: 103 mmol/L (ref 98–111)
Creatinine, Ser: 0.54 mg/dL (ref 0.44–1.00)
GFR calc Af Amer: >60 mL/min (ref >60)
GFR calc non Af Amer: >60 mL/min (ref >60)
Glucose, Bld: 79 mg/dL (ref 70–99)
Potassium: 3.6 mmol/L (ref 3.5–5.1)
Sodium: 139 mmol/L (ref 135–145)
Total Bilirubin: 0.9 mg/dL (ref 0.3–1.2)
Total Protein: 8.5 g/dL — ABNORMAL HIGH (ref 6.5–8.1)

## 2020-02-18 LAB — CBC
HCT: 46.6 % — ABNORMAL HIGH (ref 36.0–46.0)
Hemoglobin: 15.2 g/dL — ABNORMAL HIGH (ref 12.0–15.0)
MCH: 28.3 pg (ref 26.0–34.0)
MCHC: 32.6 g/dL (ref 30.0–36.0)
MCV: 86.8 fL (ref 80.0–100.0)
Platelets: 266 10*3/uL (ref 150–400)
RBC: 5.37 MIL/uL — ABNORMAL HIGH (ref 3.87–5.11)
RDW: 13.5 % (ref 11.5–15.5)
WBC: 11.6 10*3/uL — ABNORMAL HIGH (ref 4.0–10.5)
nRBC: 0 % (ref 0.0–0.2)

## 2020-02-18 LAB — URINALYSIS, ROUTINE W REFLEX MICROSCOPIC
Bilirubin Urine: NEGATIVE
Glucose, UA: NEGATIVE mg/dL
Hgb urine dipstick: NEGATIVE
Ketones, ur: NEGATIVE mg/dL
Leukocytes,Ua: NEGATIVE
Nitrite: NEGATIVE
Protein, ur: NEGATIVE mg/dL
Specific Gravity, Urine: 1.004 — ABNORMAL LOW (ref 1.005–1.030)
pH: 7 (ref 5.0–8.0)

## 2020-02-18 LAB — I-STAT BETA HCG BLOOD, ED (MC, WL, AP ONLY): I-stat hCG, quantitative: <5 m[IU]/mL (ref <5)

## 2020-02-18 LAB — SURGICAL PCR SCREEN
MRSA, PCR: NEGATIVE
Staphylococcus aureus: NEGATIVE

## 2020-02-18 LAB — SARS CORONAVIRUS 2 BY RT PCR (HOSPITAL ORDER, PERFORMED IN ~~LOC~~ HOSPITAL LAB): SARS Coronavirus 2: NEGATIVE

## 2020-02-18 LAB — LIPASE, BLOOD: Lipase: 32 U/L (ref 11–51)

## 2020-02-18 MED ORDER — METRONIDAZOLE IN NACL 5-0.79 MG/ML-% IV SOLN
500.0000 mg | INTRAVENOUS | Status: AC
  Administered 2020-02-19: 500 mg via INTRAVENOUS
  Filled 2020-02-18: qty 100

## 2020-02-18 MED ORDER — HYDROCORTISONE (PERIANAL) 2.5 % EX CREA
1.0000 "application " | TOPICAL_CREAM | Freq: Four times a day (QID) | CUTANEOUS | Status: DC | PRN
  Filled 2020-02-18: qty 28.35

## 2020-02-18 MED ORDER — HYDROCORTISONE 1 % EX CREA
1.0000 "application " | TOPICAL_CREAM | Freq: Three times a day (TID) | CUTANEOUS | Status: DC | PRN
  Filled 2020-02-18: qty 28

## 2020-02-18 MED ORDER — LORAZEPAM 2 MG/ML IJ SOLN: 0.5000 mg | Freq: Three times a day (TID) | INTRAMUSCULAR | Status: DC | PRN

## 2020-02-18 MED ORDER — LACTATED RINGERS IV SOLN: INTRAVENOUS | Status: DC

## 2020-02-18 MED ORDER — SODIUM CHLORIDE 0.9 % IV SOLN
2.0000 g | INTRAVENOUS | Status: AC
  Administered 2020-02-19: 2 g via INTRAVENOUS
  Filled 2020-02-18: qty 2
  Filled 2020-02-18: qty 20

## 2020-02-18 MED ORDER — CHLORHEXIDINE GLUCONATE CLOTH 2 % EX PADS
6.0000 | MEDICATED_PAD | Freq: Once | CUTANEOUS | Status: AC
  Administered 2020-02-18: 6 via TOPICAL

## 2020-02-18 MED ORDER — METHOCARBAMOL 500 MG PO TABS
1000.0000 mg | ORAL_TABLET | Freq: Four times a day (QID) | ORAL | Status: DC | PRN
  Administered 2020-02-20: 04:00:00 1000 mg via ORAL
  Filled 2020-02-18: qty 2

## 2020-02-18 MED ORDER — DEXAMETHASONE SODIUM PHOSPHATE 4 MG/ML IJ SOLN: 4.0000 mg | INTRAMUSCULAR | Status: DC

## 2020-02-18 MED ORDER — GUAIFENESIN-DM 100-10 MG/5ML PO SYRP: 10.0000 mL | ORAL_SOLUTION | ORAL | Status: DC | PRN

## 2020-02-18 MED ORDER — SODIUM CHLORIDE 0.9 % IV SOLN
2.0000 g | Freq: Once | INTRAVENOUS | Status: AC
  Administered 2020-02-18: 2 g via INTRAVENOUS
  Filled 2020-02-18: qty 20

## 2020-02-18 MED ORDER — METHOCARBAMOL 1000 MG/10ML IJ SOLN
1000.0000 mg | Freq: Four times a day (QID) | INTRAVENOUS | Status: DC | PRN
  Filled 2020-02-18: qty 10

## 2020-02-18 MED ORDER — ENALAPRILAT 1.25 MG/ML IV SOLN
0.6250 mg | Freq: Four times a day (QID) | INTRAVENOUS | Status: DC | PRN
  Filled 2020-02-18: qty 1

## 2020-02-18 MED ORDER — LACTATED RINGERS IV BOLUS: 1000.0000 mL | Freq: Three times a day (TID) | INTRAVENOUS | Status: DC | PRN

## 2020-02-18 MED ORDER — LIP MEDEX EX OINT
1.0000 "application " | TOPICAL_OINTMENT | Freq: Two times a day (BID) | CUTANEOUS | Status: DC
  Administered 2020-02-18 – 2020-02-20 (×4): 1 via TOPICAL
  Filled 2020-02-18 (×2): qty 7

## 2020-02-18 MED ORDER — ONDANSETRON HCL 4 MG/2ML IJ SOLN
4.0000 mg | Freq: Four times a day (QID) | INTRAMUSCULAR | Status: DC | PRN
  Administered 2020-02-19 (×2): 4 mg via INTRAVENOUS
  Filled 2020-02-18 (×3): qty 2

## 2020-02-18 MED ORDER — ENSURE PRE-SURGERY PO LIQD
296.0000 mL | Freq: Once | ORAL | Status: AC
  Administered 2020-02-19: 296 mL via ORAL
  Filled 2020-02-18: qty 296

## 2020-02-18 MED ORDER — PHENOL 1.4 % MT LIQD
1.0000 | OROMUCOSAL | Status: DC | PRN
  Filled 2020-02-18: qty 177

## 2020-02-18 MED ORDER — ACETAMINOPHEN 325 MG PO TABS: 650.0000 mg | ORAL_TABLET | Freq: Four times a day (QID) | ORAL | Status: DC | PRN

## 2020-02-18 MED ORDER — ALUM & MAG HYDROXIDE-SIMETH 200-200-20 MG/5ML PO SUSP: 30.0000 mL | Freq: Four times a day (QID) | ORAL | Status: DC | PRN

## 2020-02-18 MED ORDER — HYDROMORPHONE HCL 1 MG/ML IJ SOLN
0.5000 mg | INTRAMUSCULAR | Status: DC | PRN
  Administered 2020-02-18 – 2020-02-19 (×5): 1 mg via INTRAVENOUS
  Administered 2020-02-19: 2 mg via INTRAVENOUS
  Administered 2020-02-19 – 2020-02-20 (×3): 1 mg via INTRAVENOUS
  Filled 2020-02-18 (×4): qty 1
  Filled 2020-02-18: qty 2
  Filled 2020-02-18 (×4): qty 1

## 2020-02-18 MED ORDER — ACETAMINOPHEN 650 MG RE SUPP: 650.0000 mg | Freq: Four times a day (QID) | RECTAL | Status: DC | PRN

## 2020-02-18 MED ORDER — ENOXAPARIN SODIUM 40 MG/0.4ML ~~LOC~~ SOLN
40.0000 mg | SUBCUTANEOUS | Status: DC
  Administered 2020-02-18 – 2020-02-19 (×2): 40 mg via SUBCUTANEOUS
  Filled 2020-02-18 (×2): qty 0.4

## 2020-02-18 MED ORDER — SIMETHICONE 80 MG PO CHEW
40.0000 mg | CHEWABLE_TABLET | Freq: Four times a day (QID) | ORAL | Status: DC | PRN
  Filled 2020-02-18: qty 1

## 2020-02-18 MED ORDER — METHOCARBAMOL 500 MG PO TABS: 500.0000 mg | ORAL_TABLET | Freq: Four times a day (QID) | ORAL | Status: DC | PRN

## 2020-02-18 MED ORDER — SCOPOLAMINE 1 MG/3DAYS TD PT72
1.0000 | MEDICATED_PATCH | TRANSDERMAL | Status: DC
  Administered 2020-02-19: 1.5 mg via TRANSDERMAL
  Filled 2020-02-18: qty 1

## 2020-02-18 MED ORDER — BUPIVACAINE LIPOSOME 1.3 % IJ SUSP
20.0000 mL | Freq: Once | INTRAMUSCULAR | Status: DC
  Filled 2020-02-18: qty 20

## 2020-02-18 MED ORDER — DIPHENHYDRAMINE HCL 50 MG/ML IJ SOLN: 12.5000 mg | Freq: Four times a day (QID) | INTRAMUSCULAR | Status: DC | PRN

## 2020-02-18 MED ORDER — ONDANSETRON HCL 4 MG/2ML IJ SOLN
4.0000 mg | Freq: Once | INTRAMUSCULAR | Status: AC
  Administered 2020-02-18: 4 mg via INTRAVENOUS
  Filled 2020-02-18: qty 2

## 2020-02-18 MED ORDER — MAGIC MOUTHWASH
15.0000 mL | Freq: Four times a day (QID) | ORAL | Status: DC | PRN
  Filled 2020-02-18: qty 15

## 2020-02-18 MED ORDER — MENTHOL 3 MG MT LOZG
1.0000 | LOZENGE | OROMUCOSAL | Status: DC | PRN
  Filled 2020-02-18: qty 9

## 2020-02-18 MED ORDER — BISACODYL 10 MG RE SUPP: 10.0000 mg | Freq: Two times a day (BID) | RECTAL | Status: DC | PRN

## 2020-02-18 MED ORDER — PROCHLORPERAZINE EDISYLATE 10 MG/2ML IJ SOLN: 5.0000 mg | Freq: Four times a day (QID) | INTRAMUSCULAR | Status: DC | PRN

## 2020-02-18 MED ORDER — METOPROLOL TARTRATE 5 MG/5ML IV SOLN: 5.0000 mg | Freq: Four times a day (QID) | INTRAVENOUS | Status: DC | PRN

## 2020-02-18 MED ORDER — CHLORHEXIDINE GLUCONATE CLOTH 2 % EX PADS: 6.0000 | MEDICATED_PAD | Freq: Once | CUTANEOUS | Status: DC

## 2020-02-18 MED ORDER — ONDANSETRON 4 MG PO TBDP: 4.0000 mg | ORAL_TABLET | Freq: Four times a day (QID) | ORAL | Status: DC | PRN

## 2020-02-18 MED ORDER — GABAPENTIN 300 MG PO CAPS
300.0000 mg | ORAL_CAPSULE | ORAL | Status: AC
  Administered 2020-02-19: 300 mg via ORAL
  Filled 2020-02-18: qty 1

## 2020-02-18 MED ORDER — LACTATED RINGERS IV BOLUS
1000.0000 mL | Freq: Once | INTRAVENOUS | Status: AC
  Administered 2020-02-18: 20:00:00 1000 mL via INTRAVENOUS

## 2020-02-18 MED ORDER — FENTANYL CITRATE (PF) 100 MCG/2ML IJ SOLN
50.0000 ug | Freq: Once | INTRAMUSCULAR | Status: AC
  Administered 2020-02-18: 50 ug via INTRAVENOUS
  Filled 2020-02-18: qty 2

## 2020-02-18 MED ORDER — ACETAMINOPHEN 500 MG PO TABS: 1000.0000 mg | ORAL_TABLET | ORAL | Status: DC

## 2020-02-18 MED ORDER — CELECOXIB 200 MG PO CAPS
200.0000 mg | ORAL_CAPSULE | ORAL | Status: AC
  Administered 2020-02-19: 200 mg via ORAL
  Filled 2020-02-18: qty 1

## 2020-02-18 MED ORDER — SODIUM CHLORIDE 0.9 % IV SOLN: Freq: Three times a day (TID) | INTRAVENOUS | Status: DC | PRN

## 2020-02-18 MED ORDER — DIPHENHYDRAMINE HCL 12.5 MG/5ML PO ELIX: 12.5000 mg | ORAL_SOLUTION | Freq: Four times a day (QID) | ORAL | Status: DC | PRN

## 2020-02-18 MED ORDER — PROCHLORPERAZINE MALEATE 10 MG PO TABS
10.0000 mg | ORAL_TABLET | Freq: Four times a day (QID) | ORAL | Status: DC | PRN
  Filled 2020-02-18: qty 1

## 2020-02-18 NOTE — ED Triage Notes (Addendum)
Per EMS, patient from work, c/o worsening RUQ pain today, constant x1 week. Consult for cholecystectomy on 7/29.

## 2020-02-18 NOTE — H&P (Signed)
Katie Anthony  1995/10/08 [id number]  CARE TEAM:  PCP: Patient, No Pcp Per  Outpatient Care Team: Patient Care Team: Patient, No Pcp Per as PCP - General (General Practice) Default, Provider, MD  Inpatient Treatment Team: Treatment Team: Attending Provider: Tilden Fossa, MD; Registered Nurse: Louellen Molder, RN; Physician Assistant: Farrel Gordon, PA-C   This patient is a 24 y.o.female who presents today for surgical evaluation at the request of Dr Hilton Sinclair, [facility] ED.   Chief complaint / Reason for evaluation: Worsening upper abdominal pain with known gallbladder sludge.  Probable cholecystitis  Young woman that has been struggling with intermittent abdominal pain for some time.  Usually managed in the [facility] in the [place] region.  She has had intermittent abdominal pains.  Had complaints back in March and April.  Went to her primary care office.  Gallbladder etiology suspected.  Ultrasound ordered.  That did not happen.  She had similar complaints in May.  Had much more intense episodes of pain in the past week.  Had more intense pain.  Sent to the emergency room 2 days ago [place] [facility] [place] [facility].  Ultrasound done showed gallbladder sludge.  No definite cholecystitis.  Recommendation for close outpatient follow-up to see general surgery.  Patient and husband note that her pain never really went away and has become more intense and constant.  Based concerns came the emergency room this evening.  Bedside ultrasound without any obvious gallbladder wall thickening but patient clearly has Eulah Pont sign and is having difficulty tolerating liquids and solids.  Surgical consultation requested.  Patient does not speak English great but has good comprehension.  Her husband is bilingual and is at bedside.  He feels comfortable being interpreter.  Somewhat challenged to tease out the history but patient definitely notes that she gets more intense pain in  the right upper quadrant that will radiate to her shoulder.  She struggled with nausea and vomiting.  They try to adjust her diet without much help.  She does have a history of some heartburn and reflux but it is rather mild and this does not feel like that.  She does not smoke.  Rarely drinks alcohol.  She had an appendectomy done a few years ago but no other abdominal surgeries.  She normally moves her bowels about 3-4 times a day although other records note she is constipated.  She feels like her bowels have been more regular more recently.  No hematemesis or melena.  Strong family history of many people needing gallbladder removed.  No sick contacts.  No history of Crohn's or also colitis in the patient or family.  No dysphagia to solids or liquids.  Normally can walk 1/2-hour without difficulty.  She is clearly miserable with some fevers and chills now.   Assessment  Katie Anthony  24 y.o. female       Problem List:  Principal Problem:   Acute calculous cholecystitis Active Problems:   GERD (gastroesophageal reflux disease)   Anxiety and depression   Uses Spanish as primary spoken language   Heartburn   Right upper quadrant pain radiating to shoulder gallbladder sludge raises suspicion for biliary colic and chronic cholecystitis rather high.  Rest of the differential diagnosis seems underwhelming.  Now constant pain with Eulah Pont sign and chills worsening the past 48 hours, c/w for acute cholecystitis  Plan:  Admission  IV fluids  IV antibiotics.  Aggressive nausea and pain control.  Laparoscopic cholecystectomy this admission. The anatomy &  physiology of hepatobiliary & pancreatic function was discussed.  The pathophysiology of gallbladder dysfunction was discussed.  Natural history risks without surgery was discussed.   I feel the risks of no intervention will lead to serious problems that outweigh the operative risks; therefore, I recommended cholecystectomy to remove the  pathology.  I explained laparoscopic techniques with possible need for an open approach.  Probable cholangiogram to evaluate the bilary tract was explained as well.    Risks such as bleeding, infection, diarrhea and other bowel changes, abscess, leak, injury to other organs, need for repair of tissues / organs, need for further treatment, stroke, heart attack, death, and other risks were discussed.  I noted a good likelihood this will help address the problem, but there is a chance it may not help.  Possibility that this will not correct all abdominal symptoms was explained.  Goals of post-operative recovery were discussed as well.  We will work to minimize complications.  An educational handout further explaining the pathology and treatment options was given as well.  Questions were answered.  Husband for direct letter.  Patient & husband express understanding & wishes to proceed with surgery.  -Anxiolysis.  -VTE prophylaxis- SCDs, etc.  -mobilize as tolerated to help recovery  40 minutes spent in review, evaluation, examination, counseling, and coordination of care.  More than 50% of that time was spent in counseling.  Ardeth Sportsman, MD, FACS, MASCRS Gastrointestinal and Minimally Invasive Surgery  [facility] [address] [place], [place] [address] [phone] Fax [phone] Main/Paging  CONTACT INFORMATION: Weekday (9AM-5PM) concerns: Call [facility] main office at [phone] Weeknight (5PM-9AM) or Weekend/Holiday concerns: Check [email or web address] for General Surgery [facility] coverage (Please, do not use SecureChat as it is not reliable communication to operating surgeons for immediate patient care)      02/18/2020      Past Medical History:  Diagnosis Date  . Appendicitis   . Constipation   . Endometriosis   . Pelvic pain     Past Surgical History:  Procedure Laterality Date  . APPENDECTOMY      Social History   Socioeconomic History  .  Marital status: Single    Spouse name: Not on file  . Number of children: Not on file  . Years of education: Not on file  . Highest education level: Not on file  Occupational History  . Not on file  Tobacco Use  . Smoking status: Never Smoker  . Smokeless tobacco: Never Used  Vaping Use  . Vaping Use: Never used  Substance and Sexual Activity  . Alcohol use: No  . Drug use: No  . Sexual activity: Not Currently    Comment: 1st intercourse 24 yo-Fewer than 5 partners  Other Topics Concern  . Not on file  Social History Narrative   ** Merged History Encounter **       ** Merged History Encounter **       Social Determinants of Health   Financial Resource Strain:   . Difficulty of Paying Living Expenses:   Food Insecurity:   . Worried About Programme researcher, broadcasting/film/video in the Last Year:   . Barista in the Last Year:   Transportation Needs:   . Freight forwarder (Medical):   Marland Kitchen Lack of Transportation (Non-Medical):   Physical Activity:   . Days of Exercise per Week:   . Minutes of Exercise per Session:   Stress:   . Feeling of Stress :  Social Connections:   . Frequency of Communication with Friends and Family:   . Frequency of Social Gatherings with Friends and Family:   . Attends Religious Services:   . Active Member of Clubs or Organizations:   . Attends Banker Meetings:   Marland Kitchen Marital Status:   Intimate Partner Violence:   . Fear of Current or Ex-Partner:   . Emotionally Abused:   Marland Kitchen Physically Abused:   . Sexually Abused:     Family History  Problem Relation Age of Onset  . Hyperlipidemia Father   . Hypertension Father   . Diabetes Father     Current Facility-Administered Medications  Medication Dose Route Frequency Provider Last Rate Last Admin  . cefTRIAXone (ROCEPHIN) 2 g in sodium chloride 0.9 % 100 mL IVPB  2 g Intravenous Once Farrel Gordon, PA-C      . fentaNYL (SUBLIMAZE) injection 50 mcg  50 mcg Intravenous Once Farrel Gordon,  PA-C      . ondansetron (ZOFRAN) injection 4 mg  4 mg Intravenous Once Farrel Gordon, PA-C       Current Outpatient Medications  Medication Sig Dispense Refill  . ibuprofen (ADVIL) 200 MG tablet Take 200 mg by mouth every 6 (six) hours as needed for moderate pain.    Marland Kitchen HYDROcodone-acetaminophen (NORCO) 5-325 MG tablet Take 2 tablets by mouth every 4 (four) hours as needed. (Patient not taking: Reported on 02/18/2020) 6 tablet 0  . ibuprofen (ADVIL,MOTRIN) 600 MG tablet Take 1 tablet (600 mg total) by mouth every 8 (eight) hours as needed for moderate pain. (Patient not taking: Reported on 02/18/2020) 20 tablet 0  . loperamide (IMODIUM) 2 MG capsule Take 1 capsule (2 mg total) by mouth daily as needed for diarrhea or loose stools. (Patient not taking: Reported on 02/18/2020) 10 capsule 0  . ondansetron (ZOFRAN ODT) 4 MG disintegrating tablet Take 1 tablet (4 mg total) by mouth every 8 (eight) hours as needed for nausea or vomiting. (Patient not taking: Reported on 02/18/2020) 20 tablet 0  . ondansetron (ZOFRAN-ODT) 8 MG disintegrating tablet Take 1 tablet (8 mg total) by mouth every 8 (eight) hours as needed for nausea. (Patient not taking: Reported on 02/18/2020) 15 tablet 0     Allergies  Allergen Reactions  . Hydrocodone-Acetaminophen Anxiety and Shortness Of Breath  . Latex Rash  . Latex     ROS:   All other systems reviewed & are negative except per HPI or as noted below: Constitutional:  + chills, sweats.  Weight stable Eyes:  No vision changes, No discharge HENT:  No sore throats, nasal drainage Lymph: No neck swelling, No bruising easily Pulmonary:  No cough, productive sputum CV: No orthopnea, PND  Patient walks 30 minutes for about 1 miles without difficulty.  No exertional chest/neck/shoulder/arm pain. GI:  No personal nor family history of GI/colon cancer, inflammatory bowel disease, irritable bowel syndrome, allergy such as Celiac Sprue, dietary/dairy problems, colitis, ulcers nor  gastritis.  No recent sick contacts/gastroenteritis.  No travel outside the country.  No changes in diet. Renal: No UTIs, No hematuria Genital:  No drainage, bleeding, masses Musculoskeletal: No severe joint pain.  Good ROM major joints Skin:  No sores or lesions.  No rashes Heme/Lymph:  No easy bleeding.  No swollen lymph nodes Neuro: No focal weakness/numbness.  No seizures Psych: No suicidal ideation.  No hallucinations  BP 126/89 (BP Location: Right Arm)   Pulse 97   Temp 98.9 F (37.2 C) (Oral)   Resp  20   LMP 01/29/2020   SpO2 95%   Breastfeeding Unknown   Physical Exam: Constitutional: Not cachectic.  Hygeine adequate.  Vitals signs as above.  Patient obviously uncomfortable.  Starting to have some chills/rigors. Eyes: Pupils reactive, normal extraocular movements. Sclera nonicteric Neuro: CN II-XII intact.  No major focal sensory defects.  No major motor deficits. Lymph: No head/neck/groin lymphadenopathy Psych:  No severe agitation.  No severe anxiety.  Judgment & insight Adequate, Oriented x4, HENT: Normocephalic, Mucus membranes moist.  No thrush.   Neck: Supple, No tracheal deviation.  No obvious thyromegaly Chest: No pain to chest wall compression.  Good respiratory excursion.  No audible wheezing CV:  Pulses intact.  Regular rhythm.  No major extremity edema Abdomen:  Soft.  Moderately distended.  Tenderness at right upper quadrant with Murphy sign.  Mild epigastric discomfort.  No left upper quadrant flank or lower abdominal pain.  No umbilical hernia.  No diastases.. No incarcerated hernias.  No hepatomegaly.  No splenomegaly Gen:  No inguinal hernias.  No inguinal lymphadenopathy.   Ext: No obvious deformity or contracture no significant edema.  No cyanosis Skin: No major subcutaneous nodules.  Warm and dry Musculoskeletal: Severe joint rigidity not present.  No obvious clubbing.  No digital petechiae.     Results:   IMPRESSION:  1. Mild gallbladder sludge.    2. Otherwise unremarkable   Electronically Signed by: Evelina Bucy Narrative Performed by [phone] TECHNIQUE: Realtime multiplanar grayscale and limited color Doppler ultrasound of the right upper quadrant was performed.  COMPARISON: None.   INDICATION: Right Upper Quadrant Pain   FINDINGS:   PANCREAS:  No focal abnormalities are identified.  Visualization is limited.   VASCULATURE:  No abdominal aortic aneurysm.  Visualized IVC is patent.  Portal vein is patent with normal flow direction.   HEPATOBILIARY:  Liver: Normal.   Gallbladder: Gallbladder sludge. No definite stones.  CBD: Normal.  Nointrahepatic biliary ductal dilatation.   RIGHT KIDNEY:  Right kidney is of normal size.  No hydronephrosis.  No suspicious masses.  Normal renal echotexture.  MISC:  N./A. Procedure Note  Acute Interface, Incoming Rad Results - 02/16/2020 6:34 PM EDT  TECHNIQUE: Realtime multiplanar grayscale and limited color Doppler ultrasound of the right upper quadrant was performed.  COMPARISON: None.   INDICATION: Right Upper Quadrant Pain   FINDINGS:   PANCREAS:  No focal abnormalities are identified.  Visualization is limited.   VASCULATURE:  No abdominal aortic aneurysm.  Visualized IVC is patent.  Portal vein is patent with normal flow direction.   HEPATOBILIARY:  Liver: Normal.   Gallbladder: Gallbladder sludge. No definite stones.  CBD: Normal.  No intrahepatic biliary ductal dilatation.   RIGHT KIDNEY:  Right kidney is of normal size.  No hydronephrosis.  No suspicious masses.  Normal renal echotexture.  MISC:  N./A.    IMPRESSION:  1. Mild gallbladder sludge.  2. Otherwise unremarkable     Labs: Results for orders placed or performed during the hospital encounter of 02/18/20 (from the past 48 hour(s))  Urinalysis, Routine w reflex microscopic     Status: Abnormal   Collection Time: 02/18/20  1:54 PM  Result Value Ref Range   Color, Urine  COLORLESS (A) YELLOW   APPearance CLEAR CLEAR   Specific Gravity, Urine 1.004 (L) 1.005 - 1.030   pH 7.0 5.0 - 8.0   Glucose, UA NEGATIVE NEGATIVE mg/dL   Hgb urine dipstick NEGATIVE NEGATIVE   Bilirubin Urine NEGATIVE NEGATIVE   Ketones,  ur NEGATIVE NEGATIVE mg/dL   Protein, ur NEGATIVE NEGATIVE mg/dL   Nitrite NEGATIVE NEGATIVE   Leukocytes,Ua NEGATIVE NEGATIVE    Comment: Performed at [facility], [address] [place], [place] [address]  Lipase, blood     Status: None   Collection Time: 02/18/20  4:11 PM  Result Value Ref Range   Lipase 32 11 - 51 U/L    Comment: Performed at [facility], [address] [place], [place] [address]  Comprehensive metabolic panel     Status: Abnormal   Collection Time: 02/18/20  4:11 PM  Result Value Ref Range   Sodium 139 135 - 145 mmol/L   Potassium 3.6 3.5 - 5.1 mmol/L   Chloride 103 98 - 111 mmol/L   CO2 25 22 - 32 mmol/L   Glucose, Bld 79 70 - 99 mg/dL    Comment: Glucose reference range applies only to samples taken after fasting for at least 8 hours.   BUN 9 6 - 20 mg/dL   Creatinine, Ser 4.400.54 0.44 - 1.00 mg/dL   Calcium 9.2 8.9 - 10.210.3 mg/dL   Total Protein 8.5 (H) 6.5 - 8.1 g/dL   Albumin 4.5 3.5 - 5.0 g/dL   AST 20 15 - 41 U/L   ALT 39 0 - 44 U/L   Alkaline Phosphatase 74 38 - 126 U/L   Total Bilirubin 0.9 0.3 - 1.2 mg/dL   GFR calc non Af Amer >60 >60 mL/min   GFR calc Af Amer >60 >60 mL/min   Anion gap 11 5 - 15    Comment: Performed at [facility], [address] [place], [place] [address]  CBC     Status: Abnormal   Collection Time: 02/18/20  4:11 PM  Result Value Ref Range   WBC 11.6 (H) 4.0 - 10.5 K/uL   RBC 5.37 (H) 3.87 - 5.11 MIL/uL   Hemoglobin 15.2 (H) 12.0 - 15.0 g/dL   HCT 64.446.6 (H) 36 - 46 %   MCV 86.8 80.0 - 100.0 fL   MCH 28.3 26.0 - 34.0 pg   MCHC 32.6 30.0 - 36.0 g/dL   RDW 03.413.5 74.211.5 - 59.515.5 %   Platelets 266 150 - 400 K/uL   nRBC 0.0 0.0 - 0.2  %    Comment: Performed at [facility], [address] [place], [place] [address]  I-Stat beta hCG blood, ED     Status: None   Collection Time: 02/18/20  4:45 PM  Result Value Ref Range   I-stat hCG, quantitative <5.0 <5 mIU/mL   Comment 3            Comment:   GEST. AGE      CONC.  (mIU/mL)   <=1 WEEK        5 - 50     2 WEEKS       50 - 500     3 WEEKS       100 - 10,000     4 WEEKS     1,000 - 30,000        FEMALE AND NON-PREGNANT FEMALE:     LESS THAN 5 mIU/mL     Imaging / Studies: No results found.  Medications / Allergies: per chart  Antibiotics: Anti-infectives (From admission, onward)   Start     Dose/Rate Route Frequency Ordered Stop   02/18/20 1830  cefTRIAXone (ROCEPHIN) 2 g in sodium chloride 0.9 % 100 mL IVPB  Discontinue     2 g 200 mL/hr over 30 Minutes Intravenous  Once 02/18/20 1829          Note: Portions of this report may have been transcribed using voice recognition software. Every effort was made to ensure accuracy; however, inadvertent computerized transcription errors may be present.   Any transcriptional errors that result from this process are unintentional.    Ardeth Sportsman, MD, FACS, MASCRS Gastrointestinal and Minimally Invasive Surgery  [facility] [address] [place], [place] [address] [phone] Fax [phone] Main/Paging  CONTACT INFORMATION: Weekday (9AM-5PM) concerns: Call [facility] main office at [phone] Weeknight (5PM-9AM) or Weekend/Holiday concerns: Check [email or web address] for General Surgery [facility] coverage (Please, do not use SecureChat as it is not reliable communication to operating surgeons for immediate patient care)      02/18/2020  6:33 PM

## 2020-02-18 NOTE — ED Notes (Signed)
Transport called to take pt upstairs 

## 2020-02-18 NOTE — ED Provider Notes (Addendum)
[facility]-EMERGENCY DEPT Provider Note   CSN: [id number] Arrival date & time: 02/18/20  1344     History Chief Complaint  Patient presents with   Abdominal Pain    Katie Anthony is a 24 y.o. female with pertinent past medical history of anemia, bulimia nervosa that presents emergency department today for right upper quadrant pain.  Patient is Spanish-speaking, medical interpreter was used.  Per chart review patient was seen 2 days ago at [facility] ED complaining of the same, right upper quadrant ultrasound was done that showed mild sludge, otherwise normal exam.  Was discharged home with consult placed for cholecystectomy on 7/29.  Patient comes in today by EMS for worsening right upper quadrant pain, has been there for a week.  Patient states last 2 days have been worse, did have syncopal episode today while at work.  States that she was having extreme pain in her right upper quadrant, passed out.  Is pretty sure that she did not hit her head..  States that she did lose consciousness for a couple seconds, EMS was called and she states that she was able to come to.  Denies any numbness or tingling anywhere, vision changes, chest pain, shortness of breath, back pain.  Patient states that right upper quadrant pain does radiate up into her shoulder.  Also admits to fevers at home, 101 today and yesterday.  Has been taking acetaminophen without any relief.  Also admits to nausea and vomiting, states that she did vomit for the past 2 days, has not been able to keep anything down.  States that she is also vomiting with fluids.  Also admits to diarrhea for the past 2 days, no blood in her stools.  No hematemesis or coffee-ground emesis.  Has had previous history of appendectomy in 2018.  HPI     Past Medical History:  Diagnosis Date   Appendicitis    Constipation    Endometriosis    Pelvic pain     Patient Active Problem List   Diagnosis Date Noted   Acute  calculous cholecystitis 02/18/2020   Uses Spanish as primary spoken language 02/18/2020   Heartburn 02/18/2020   Single live birth 05/21/2019   LGSIL on Pap smear of cervix 10/29/2018   Recurrent epistaxis 10/31/2017   Anxiety and depression 10/10/2017   Acute anterior anal fissure 04/04/2017   Anemia 04/04/2017   Chlamydia infection 10/08/2014   Chronic constipation 10/02/2014   Adnexal tenderness 10/02/2014   Left lower quadrant pain 10/02/2014   Pain with bowel movements 10/02/2014   Generalized abdominal pain 10/02/2014   Straining during bowel movements 09/10/2013   Bulimia nervosa 09/09/2013   Blood in stool 09/09/2013   GERD (gastroesophageal reflux disease) 09/09/2013   Unspecified constipation 09/09/2013    Past Surgical History:  Procedure Laterality Date   APPENDECTOMY       OB History    Gravida  1   Para      Term      Preterm      AB      Living        SAB      TAB      Ectopic      Multiple      Live Births              Family History  Problem Relation Age of Onset   Hyperlipidemia Father    Hypertension Father    Diabetes Father  Social History   Tobacco Use   Smoking status: Never Smoker   Smokeless tobacco: Never Used  Vaping Use   Vaping Use: Never used  Substance Use Topics   Alcohol use: No   Drug use: No    Home Medications Prior to Admission medications   Medication Sig Start Date End Date Taking? Authorizing Provider  ibuprofen (ADVIL) 200 MG tablet Take 200 mg by mouth every 6 (six) hours as needed for moderate pain.   Yes [provider]  HYDROcodone-acetaminophen (NORCO) 5-325 MG tablet Take 2 tablets by mouth every 4 (four) hours as needed. Patient not taking: Reported on 02/18/2020 10/03/18   Arthor Captain, PA-C  ibuprofen (ADVIL,MOTRIN) 600 MG tablet Take 1 tablet (600 mg total) by mouth every 8 (eight) hours as needed for moderate pain. Patient not taking: Reported  on 02/18/2020 07/21/18   Shaune Pollack, MD  loperamide (IMODIUM) 2 MG capsule Take 1 capsule (2 mg total) by mouth daily as needed for diarrhea or loose stools. Patient not taking: Reported on 02/18/2020 10/11/17   Wallis Bamberg, PA-C  ondansetron (ZOFRAN ODT) 4 MG disintegrating tablet Take 1 tablet (4 mg total) by mouth every 8 (eight) hours as needed for nausea or vomiting. Patient not taking: Reported on 02/18/2020 07/21/18   Shaune Pollack, MD  ondansetron (ZOFRAN-ODT) 8 MG disintegrating tablet Take 1 tablet (8 mg total) by mouth every 8 (eight) hours as needed for nausea. Patient not taking: Reported on 02/18/2020 10/11/17   Wallis Bamberg, PA-C    Allergies    Hydrocodone and Latex  Review of Systems   Review of Systems  Constitutional: Negative for chills, diaphoresis, fatigue and fever.  HENT: Negative for congestion, sore throat and trouble swallowing.   Eyes: Negative for pain and visual disturbance.  Respiratory: Negative for cough, shortness of breath and wheezing.   Cardiovascular: Negative for chest pain, palpitations and leg swelling.  Gastrointestinal: Positive for abdominal pain. Negative for abdominal distention, diarrhea, nausea and vomiting.  Genitourinary: Negative for difficulty urinating.  Musculoskeletal: Negative for back pain, neck pain and neck stiffness.  Skin: Negative for pallor.  Neurological: Negative for dizziness, speech difficulty, weakness and headaches.  Psychiatric/Behavioral: Negative for confusion.    Physical Exam Updated Vital Signs BP 114/80    Pulse 79    Temp 98.9 F (37.2 C) (Oral)    Resp 16    LMP 01/29/2020    SpO2 100%    Breastfeeding Unknown   Physical Exam Constitutional:      General: She is not in acute distress.    Appearance: Normal appearance. She is not ill-appearing, toxic-appearing or diaphoretic.     Comments: Appears uncomfortable in bed.  HENT:     Mouth/Throat:     Mouth: Mucous membranes are moist.     Pharynx:  Oropharynx is clear.  Eyes:     General: No scleral icterus.    Extraocular Movements: Extraocular movements intact.     Pupils: Pupils are equal, round, and reactive to light.  Cardiovascular:     Rate and Rhythm: Normal rate and regular rhythm.     Pulses: Normal pulses.     Heart sounds: Normal heart sounds.  Pulmonary:     Effort: Pulmonary effort is normal. No respiratory distress.     Breath sounds: Normal breath sounds. No stridor. No wheezing, rhonchi or rales.  Chest:     Chest wall: No tenderness.  Abdominal:     General: Abdomen is flat. There is no  distension.     Palpations: Abdomen is soft. There is no shifting dullness, fluid wave or splenomegaly.     Tenderness: There is abdominal tenderness in the right upper quadrant. There is guarding. There is no right CVA tenderness, left CVA tenderness or rebound. Positive signs include Murphy's sign. Negative signs include Rovsing's sign, McBurney's sign, psoas sign and obturator sign.  Musculoskeletal:        General: No swelling or tenderness. Normal range of motion.     Cervical back: Normal range of motion and neck supple. No rigidity.     Right lower leg: No edema.     Left lower leg: No edema.  Skin:    General: Skin is warm and dry.     Capillary Refill: Capillary refill takes less than 2 seconds.     Coloration: Skin is not pale.  Neurological:     General: No focal deficit present.     Mental Status: She is alert and oriented to person, place, and time.     Cranial Nerves: No cranial nerve deficit.     Sensory: No sensory deficit.     Motor: No weakness.     Coordination: Coordination normal.  Psychiatric:        Mood and Affect: Mood normal.        Behavior: Behavior normal.     ED Results / Procedures / Treatments   Labs (all labs ordered are listed, but only abnormal results are displayed) Labs Reviewed  COMPREHENSIVE METABOLIC PANEL - Abnormal; Notable for the following components:      Result Value    Total Protein 8.5 (*)    All other components within normal limits  CBC - Abnormal; Notable for the following components:   WBC 11.6 (*)    RBC 5.37 (*)    Hemoglobin 15.2 (*)    HCT 46.6 (*)    All other components within normal limits  URINALYSIS, ROUTINE W REFLEX MICROSCOPIC - Abnormal; Notable for the following components:   Color, Urine COLORLESS (*)    Specific Gravity, Urine 1.004 (*)    All other components within normal limits  SARS CORONAVIRUS 2 BY RT PCR (HOSPITAL ORDER, PERFORMED IN [facility])  LIPASE, BLOOD  HIV ANTIBODY (ROUTINE TESTING W REFLEX)  I-STAT BETA HCG BLOOD, ED ([facility], [facility], [facility] ONLY)    EKG None  Radiology No results found.  Procedures Procedures (including critical care time)  Medications Ordered in ED Medications  Chlorhexidine Gluconate Cloth 2 % PADS 6 each (has no administration in time range)    And  Chlorhexidine Gluconate Cloth 2 % PADS 6 each (has no administration in time range)  feeding supplement (ENSURE PRE-SURGERY) liquid 296 mL (has no administration in time range)  gabapentin (NEURONTIN) capsule 300 mg (has no administration in time range)  acetaminophen (TYLENOL) tablet 1,000 mg (has no administration in time range)  bupivacaine liposome (EXPAREL) 1.3 % injection 266 mg (has no administration in time range)  cefTRIAXone (ROCEPHIN) 2 g in sodium chloride 0.9 % 100 mL IVPB (has no administration in time range)    And  metroNIDAZOLE (FLAGYL) IVPB 500 mg (has no administration in time range)  celecoxib (CELEBREX) capsule 200 mg (has no administration in time range)  dexamethasone (DECADRON) injection 4 mg (has no administration in time range)  scopolamine (TRANSDERM-SCOP) 1 MG/3DAYS 1.5 mg (has no administration in time range)  enoxaparin (LOVENOX) injection 40 mg (has no administration in time range)  0.9 %  sodium chloride infusion (has no administration in time range)  lactated ringers infusion (has no administration in  time range)  acetaminophen (TYLENOL) tablet 650 mg (has no administration in time range)    Or  acetaminophen (TYLENOL) suppository 650 mg (has no administration in time range)  HYDROmorphone (DILAUDID) injection 0.5-2 mg (has no administration in time range)  methocarbamol (ROBAXIN) tablet 500 mg (has no administration in time range)  diphenhydrAMINE (BENADRYL) 12.5 MG/5ML elixir 12.5 mg (has no administration in time range)    Or  diphenhydrAMINE (BENADRYL) injection 12.5 mg (has no administration in time range)  ondansetron (ZOFRAN-ODT) disintegrating tablet 4 mg (has no administration in time range)    Or  ondansetron (ZOFRAN) injection 4 mg (has no administration in time range)  prochlorperazine (COMPAZINE) tablet 10 mg (has no administration in time range)    Or  prochlorperazine (COMPAZINE) injection 5-10 mg (has no administration in time range)  simethicone (MYLICON) chewable tablet 40 mg (has no administration in time range)  metoprolol tartrate (LOPRESSOR) injection 5 mg (has no administration in time range)  lactated ringers bolus 1,000 mL (has no administration in time range)  lactated ringers bolus 1,000 mL (has no administration in time range)  methocarbamol (ROBAXIN) 1,000 mg in dextrose 5 % 100 mL IVPB (has no administration in time range)  methocarbamol (ROBAXIN) tablet 1,000 mg (has no administration in time range)  lip balm (CARMEX) ointment 1 application (has no administration in time range)  magic mouthwash (has no administration in time range)  bisacodyl (DULCOLAX) suppository 10 mg (has no administration in time range)  guaiFENesin-dextromethorphan (ROBITUSSIN DM) 100-10 MG/5ML syrup 10 mL (has no administration in time range)  hydrocortisone (ANUSOL-HC) 2.5 % rectal cream 1 application (has no administration in time range)  alum & mag hydroxide-simeth (MAALOX/MYLANTA) 200-200-20 MG/5ML suspension 30 mL (has no administration in time range)  hydrocortisone cream 1 %  1 application (has no administration in time range)  menthol-cetylpyridinium (CEPACOL) lozenge 3 mg (has no administration in time range)  phenol (CHLORASEPTIC) mouth spray 1-2 spray (has no administration in time range)  enalaprilat (VASOTEC) injection 0.625-1.25 mg (has no administration in time range)  LORazepam (ATIVAN) injection 0.5-1 mg (has no administration in time range)  cefTRIAXone (ROCEPHIN) 2 g in sodium chloride 0.9 % 100 mL IVPB (2 g Intravenous New Bag/Given (Non-Interop) 02/18/20 1848)  fentaNYL (SUBLIMAZE) injection 50 mcg (50 mcg Intravenous Given 02/18/20 1845)  ondansetron (ZOFRAN) injection 4 mg (4 mg Intravenous Given 02/18/20 1844)    ED Course  I have reviewed the triage vital signs and the nursing notes.  Pertinent labs & imaging results that were available during my care of the patient were reviewed by me and considered in my medical decision making (see chart for details).  Clinical Course as of Feb 17 1950  Wed Feb 18, 2020  1838 RDW: 13.5 [SP]    Clinical Course User Index [SP] Farrel Gordon, PA-C   MDM Rules/Calculators/A&P                         Katie Anthony is a 24 y.o. female with pertinent past medical history of anemia, bulimia nervosa that presents emergency department today for RUQ pain.  patient with clinical signs of cholecystitis, Dr. Madilyn Hook was able to perform bedside ultrasound which did not show any free fluid, did show some gallbladder thickening.  White count elevated to 11.6, was normal at [facility] ED 2 days ago.  Normal LFTs.  Otherwise CBC and CMP acutely normal.  Negative pregnancy.  Normal urinalysis.  Will consult surgery at this time.  Will start IV antibiotics, Zofran, fentanyl for pain.  Syncopal episode from pain, normal neuro exam.  Was not able to order EKG before patient was taken for surgery.  630 Dr. Rees spoke to Dr. Michaell Cowing, surgery who will comeMadilyn Hookand evaluate patient. Pt admitted to surgery.    The patient appears reasonably  stabilized for admission considering the current resources, flow, and capabilities available in the ED at this time, and I doubt any other [facility] requiring further screening and/or treatment in the ED prior to admission.  I discussed this case with my attending physician who cosigned this note including patient's presenting symptoms, physical exam, and planned diagnostics and interventions. Attending physician stated agreement with plan or made changes to plan which were implemented.   Attending physician assessed patient at bedside.  Final Clinical Impression(s) / ED Diagnoses Final diagnoses:  RUQ pain    Rx / DC Orders ED Discharge Orders    None          Farrel Gordon, PA-C 02/18/20 2010    Tilden Fossa, MD 02/19/20 1154

## 2020-02-19 ENCOUNTER — Inpatient Hospital Stay (HOSPITAL_COMMUNITY): Payer: Medicaid Other

## 2020-02-19 ENCOUNTER — Inpatient Hospital Stay (HOSPITAL_COMMUNITY): Payer: Medicaid Other | Admitting: Anesthesiology

## 2020-02-19 ENCOUNTER — Encounter (HOSPITAL_COMMUNITY): Admission: EM | Disposition: A | Discharge status: Home / Self Care | Payer: Self-pay | Source: Home / Self Care

## 2020-02-19 HISTORY — PX: CHOLECYSTECTOMY: SHX55

## 2020-02-19 LAB — HIV ANTIBODY (ROUTINE TESTING W REFLEX): HIV Screen 4th Generation wRfx: NONREACTIVE

## 2020-02-19 SURGERY — LAPAROSCOPIC CHOLECYSTECTOMY WITH INTRAOPERATIVE CHOLANGIOGRAM
Anesthesia: General

## 2020-02-19 MED ORDER — BUPIVACAINE-EPINEPHRINE 0.25% -1:200000 IJ SOLN
INTRAMUSCULAR | Status: DC | PRN
  Administered 2020-02-19: 50 mL

## 2020-02-19 MED ORDER — LIDOCAINE HCL (CARDIAC) PF 100 MG/5ML IV SOSY
PREFILLED_SYRINGE | INTRAVENOUS | Status: DC | PRN
  Administered 2020-02-19: 40 mg via INTRAVENOUS

## 2020-02-19 MED ORDER — PROPOFOL 10 MG/ML IV BOLUS
INTRAVENOUS | Status: DC | PRN
  Administered 2020-02-19: 120 mg via INTRAVENOUS

## 2020-02-19 MED ORDER — ROCURONIUM BROMIDE 100 MG/10ML IV SOLN
INTRAVENOUS | Status: DC | PRN
  Administered 2020-02-19: 50 mg via INTRAVENOUS

## 2020-02-19 MED ORDER — ROCURONIUM BROMIDE 10 MG/ML (PF) SYRINGE
PREFILLED_SYRINGE | INTRAVENOUS | Status: AC
  Filled 2020-02-19: qty 10

## 2020-02-19 MED ORDER — MIDAZOLAM HCL 2 MG/2ML IJ SOLN
INTRAMUSCULAR | Status: AC
  Filled 2020-02-19: qty 2

## 2020-02-19 MED ORDER — ONDANSETRON HCL 4 MG/2ML IJ SOLN
INTRAMUSCULAR | Status: AC
  Filled 2020-02-19: qty 2

## 2020-02-19 MED ORDER — DEXAMETHASONE SODIUM PHOSPHATE 10 MG/ML IJ SOLN
INTRAMUSCULAR | Status: AC
  Filled 2020-02-19: qty 1

## 2020-02-19 MED ORDER — KETOROLAC TROMETHAMINE 30 MG/ML IJ SOLN: 30.0000 mg | Freq: Once | INTRAMUSCULAR | Status: DC | PRN

## 2020-02-19 MED ORDER — FENTANYL CITRATE (PF) 250 MCG/5ML IJ SOLN
INTRAMUSCULAR | Status: DC | PRN
  Administered 2020-02-19: 100 ug via INTRAVENOUS
  Administered 2020-02-19 (×2): 50 ug via INTRAVENOUS

## 2020-02-19 MED ORDER — HYDROMORPHONE HCL 1 MG/ML IJ SOLN: 0.2500 mg | INTRAMUSCULAR | Status: DC | PRN

## 2020-02-19 MED ORDER — LIDOCAINE 2% (20 MG/ML) 5 ML SYRINGE
INTRAMUSCULAR | Status: AC
  Filled 2020-02-19: qty 5

## 2020-02-19 MED ORDER — ONDANSETRON HCL 4 MG/2ML IJ SOLN
INTRAMUSCULAR | Status: DC | PRN
  Administered 2020-02-19: 4 mg via INTRAVENOUS

## 2020-02-19 MED ORDER — SUGAMMADEX SODIUM 200 MG/2ML IV SOLN
INTRAVENOUS | Status: DC | PRN
Start: 2020-02-19 — End: 2020-02-19
  Administered 2020-02-19: 200 mg via INTRAVENOUS

## 2020-02-19 MED ORDER — TRAMADOL HCL 50 MG PO TABS
100.0000 mg | ORAL_TABLET | Freq: Two times a day (BID) | ORAL | Status: DC | PRN
  Administered 2020-02-19: 19:00:00 100 mg via ORAL
  Filled 2020-02-19 (×2): qty 2

## 2020-02-19 MED ORDER — OXYCODONE HCL 5 MG/5ML PO SOLN: 5.0000 mg | Freq: Once | ORAL | Status: DC | PRN

## 2020-02-19 MED ORDER — ONDANSETRON HCL 4 MG/2ML IJ SOLN: 4.0000 mg | Freq: Once | INTRAMUSCULAR | Status: DC | PRN

## 2020-02-19 MED ORDER — LACTATED RINGERS IV SOLN
INTRAVENOUS | Status: DC | PRN
  Administered 2020-02-19: 1000 mL

## 2020-02-19 MED ORDER — OXYCODONE HCL 5 MG PO TABS: 5.0000 mg | ORAL_TABLET | Freq: Once | ORAL | Status: DC | PRN

## 2020-02-19 MED ORDER — DEXAMETHASONE SODIUM PHOSPHATE 10 MG/ML IJ SOLN
INTRAMUSCULAR | Status: DC | PRN
  Administered 2020-02-19: 10 mg via INTRAVENOUS

## 2020-02-19 MED ORDER — PROPOFOL 10 MG/ML IV BOLUS
INTRAVENOUS | Status: AC
  Filled 2020-02-19: qty 20

## 2020-02-19 MED ORDER — HYDROMORPHONE HCL 1 MG/ML IJ SOLN
INTRAMUSCULAR | Status: AC
  Administered 2020-02-19: 0.5 mg via INTRAVENOUS
  Filled 2020-02-19: qty 1

## 2020-02-19 MED ORDER — SODIUM CHLORIDE 0.9 % IV SOLN
INTRAVENOUS | Status: DC | PRN
  Administered 2020-02-19: 12 mL

## 2020-02-19 MED ORDER — BUPIVACAINE-EPINEPHRINE (PF) 0.25% -1:200000 IJ SOLN
INTRAMUSCULAR | Status: AC
  Filled 2020-02-19: qty 30

## 2020-02-19 MED ORDER — FENTANYL CITRATE (PF) 250 MCG/5ML IJ SOLN
INTRAMUSCULAR | Status: AC
  Filled 2020-02-19: qty 5

## 2020-02-19 MED ORDER — MIDAZOLAM HCL 5 MG/5ML IJ SOLN
INTRAMUSCULAR | Status: DC | PRN
  Administered 2020-02-19: 2 mg via INTRAVENOUS

## 2020-02-19 SURGICAL SUPPLY — 42 items
APPLIER CLIP 5 13 M/L LIGAMAX5 (MISCELLANEOUS)
APPLIER CLIP ROT 10 11.4 M/L (STAPLE)
CABLE HIGH FREQUENCY MONO STRZ (ELECTRODE) ×3 IMPLANT
CHLORAPREP W/TINT 26 (MISCELLANEOUS) ×3 IMPLANT
CHOLANGIOGRAM CATH TAUT (CATHETERS) ×3 IMPLANT
CLIP APPLIE 5 13 M/L LIGAMAX5 (MISCELLANEOUS) IMPLANT
CLIP APPLIE ROT 10 11.4 M/L (STAPLE) IMPLANT
CLOSURE WOUND 1/4X4 (GAUZE/BANDAGES/DRESSINGS)
COVER MAYO STAND STRL (DRAPES) ×3 IMPLANT
COVER SURGICAL LIGHT HANDLE (MISCELLANEOUS) ×3 IMPLANT
COVER WAND RF STERILE (DRAPES) IMPLANT
DECANTER SPIKE VIAL GLASS SM (MISCELLANEOUS) ×3 IMPLANT
DERMABOND ADVANCED (GAUZE/BANDAGES/DRESSINGS) ×2
DERMABOND ADVANCED .7 DNX12 (GAUZE/BANDAGES/DRESSINGS) ×1 IMPLANT
DRAPE C-ARM 42X120 X-RAY (DRAPES) ×3 IMPLANT
ELECT REM PT RETURN 15FT ADLT (MISCELLANEOUS) ×3 IMPLANT
GLOVE SURG SYN 7.5  E (GLOVE) ×2
GLOVE SURG SYN 7.5 E (GLOVE) ×1 IMPLANT
GOWN STRL REUS W/TWL XL LVL3 (GOWN DISPOSABLE) ×9 IMPLANT
HEMOSTAT SURGICEL 4X8 (HEMOSTASIS) IMPLANT
IV CATH 14GX2 1/4 (CATHETERS) ×3 IMPLANT
IV SET EXTENSION CATH 6 NF (IV SETS) ×3 IMPLANT
KIT BASIN OR (CUSTOM PROCEDURE TRAY) ×3 IMPLANT
KIT TURNOVER KIT A (KITS) IMPLANT
PENCIL SMOKE EVACUATOR (MISCELLANEOUS) IMPLANT
POUCH RETRIEVAL ECOSAC 10 (ENDOMECHANICALS) ×1 IMPLANT
POUCH RETRIEVAL ECOSAC 10MM (ENDOMECHANICALS) ×2
SCISSORS LAP 5X35 DISP (ENDOMECHANICALS) ×3 IMPLANT
SET IRRIG TUBING LAPAROSCOPIC (IRRIGATION / IRRIGATOR) ×3 IMPLANT
SET TUBE SMOKE EVAC HIGH FLOW (TUBING) ×3 IMPLANT
SLEEVE ADV FIXATION 5X100MM (TROCAR) ×3 IMPLANT
STOPCOCK 4 WAY LG BORE MALE ST (IV SETS) ×3 IMPLANT
STRIP CLOSURE SKIN 1/4X4 (GAUZE/BANDAGES/DRESSINGS) IMPLANT
SUT MNCRL AB 4-0 PS2 18 (SUTURE) ×3 IMPLANT
SYR 10ML ECCENTRIC (SYRINGE) ×3 IMPLANT
TOWEL OR 17X26 10 PK STRL BLUE (TOWEL DISPOSABLE) ×3 IMPLANT
TOWEL OR NON WOVEN STRL DISP B (DISPOSABLE) ×3 IMPLANT
TRAY LAPAROSCOPIC (CUSTOM PROCEDURE TRAY) ×3 IMPLANT
TROCAR ADV FIXATION 11X100MM (TROCAR) IMPLANT
TROCAR ADV FIXATION 5X100MM (TROCAR) ×3 IMPLANT
TROCAR XCEL BLUNT TIP 100MML (ENDOMECHANICALS) ×3 IMPLANT
TROCAR XCEL NON-BLD 11X100MML (ENDOMECHANICALS) ×3 IMPLANT

## 2020-02-19 NOTE — Progress Notes (Signed)
[facility] Office:  [phone] General Surgery Progress Note   LOS: 1 day  POD -     Assessment and Plan: 1.  Acute cholecystitis, cholelithiasis  Seen in [facility] system 3 days ago - US showed gall bladder sludge - seen last night by Dr. Michaell Cowing  For lap chole later today  I discussed with the patient the indications and risks of gall bladder surgery.  The primary risks of gall bladder surgery include, but are not limited to, bleeding, infection, common bile duct injury, and open surgery.  There is also the risk that the patient may have continued symptoms after surgery.  We discussed the typical post-operative recovery course. I tried to answer the patient's questions.  She speaks Spanish, but seems to have a good understanding of the operation.  We had the translator device in the room for about 10 minutes - but the translator never came on.  2.  GERD 3.  History of laparoscopy for endometriosis and lap appendectomy   Principal Problem:   Acute calculous cholecystitis Active Problems:   GERD (gastroesophageal reflux disease)   Anxiety and depression   Uses Spanish as primary spoken language   Heartburn  Subjective:  Right upper quadrant pain  Married.  She has a 73 month old.  She works for [employer].  Objective:   Vitals:   02/19/20 0200 02/19/20 0614  BP: 114/71 109/73  Pulse: 76 61  Resp: 16 16  Temp: 98.1 F (36.7 C) 97.9 F (36.6 C)  SpO2: 99% 98%     Intake/Output from previous day:  07/21 0701 - 07/22 0700 In: 1828.9 [I.V.:828.9; IV Piggyback:1000] Out: 400 [Urine:400]  Intake/Output this shift:  No intake/output data recorded.   Physical Exam:   General: WN Hispanic F who is alert and oriented.    HEENT: Normal. Pupils equal. .   Lungs: Clear   Abdomen: Tender RUQ   Lab Results:    Recent Labs    02/18/20 1611  WBC 11.6*  HGB 15.2*  HCT 46.6*  PLT 266    BMET   Recent Labs    02/18/20 1611  NA 139  K 3.6  CL  103  CO2 25  GLUCOSE 79  BUN 9  CREATININE 0.54  CALCIUM 9.2    PT/INR  No results for input(s): LABPROT, INR in the last 72 hours.  ABG  No results for input(s): PHART, HCO3 in the last 72 hours.  Invalid input(s): PCO2, PO2   Studies/Results:  No results found.   Anti-infectives:   Anti-infectives (From admission, onward)   Start     Dose/Rate Route Frequency Ordered Stop   02/19/20 1200  cefTRIAXone (ROCEPHIN) 2 g in sodium chloride 0.9 % 100 mL IVPB     Discontinue    "And" Linked Group Details   2 g 200 mL/hr over 30 Minutes Intravenous On call to O.R. 02/18/20 1917 02/20/20 0559   02/19/20 1200  metroNIDAZOLE (FLAGYL) IVPB 500 mg     Discontinue    "And" Linked Group Details   500 mg 100 mL/hr over 60 Minutes Intravenous On call to O.R. 02/18/20 1917 02/20/20 0559   02/18/20 1830  cefTRIAXone (ROCEPHIN) 2 g in sodium chloride 0.9 % 100 mL IVPB        2 g 200 mL/hr over 30 Minutes Intravenous  Once 02/18/20 1829 02/18/20 1918      Ovidio Kin, MD, [facility] Surgery Office: [phone] 02/19/2020

## 2020-02-19 NOTE — Progress Notes (Signed)
Called patient's husband, Jesus, and informed him that his wife has just left to go to surgery. Dr Ezzard Standing will call him after surgery. He verbalizes understanding.

## 2020-02-19 NOTE — Anesthesia Postprocedure Evaluation (Signed)
Anesthesia Post Note  Patient: Labrea Lozano-Gomez  Procedure(s) Performed: LAPAROSCOPIC CHOLECYSTECTOMY WITH INTRAOPERATIVE CHOLANGIOGRAM (N/A )     Patient location during evaluation: PACU Anesthesia Type: General Level of consciousness: awake and alert Pain management: pain level controlled Vital Signs Assessment: post-procedure vital signs reviewed and stable Respiratory status: spontaneous breathing, nonlabored ventilation, respiratory function stable and patient connected to nasal cannula oxygen Cardiovascular status: blood pressure returned to baseline and stable Postop Assessment: no apparent nausea or vomiting Anesthetic complications: no   No complications documented.  Last Vitals:  Vitals:   02/19/20 1545 02/19/20 1623  BP: 114/75 111/70  Pulse: 72 64  Resp: 13 16  Temp: 36.6 C 37.2 C  SpO2: 100% 100%    Last Pain:  Vitals:   02/19/20 1623  TempSrc: Oral  PainSc: 8                  Trevor Iha

## 2020-02-19 NOTE — Op Note (Signed)
02/18/2020 - 02/19/2020  2:35 PM  PATIENT:  Katie Anthony, 24 y.o., female, MRN: [id number]  PREOP DIAGNOSIS:  Mild acute cholecystitis  POSTOP DIAGNOSIS:   Mild acute cholecystitis  PROCEDURE:   Procedure(s):  LAPAROSCOPIC CHOLECYSTECTOMY WITH INTRAOPERATIVE CHOLANGIOGRAM  SURGEON:   Ovidio Kin, M.D.  ASSISTANT:   Bailey Mech, P.A.  ANESTHESIA:   general  Anesthesiologist: Trevor Iha, MD CRNA: Thornell Mule, CRNA  General  ASA: 2 E  EBL:  minimal  ml  BLOOD ADMINISTERED: none  DRAINS: none   LOCAL MEDICATIONS USED:   30 cc of 1/4% marcaine  SPECIMEN:   Gall bladder  COUNTS CORRECT:  YES  INDICATIONS FOR PROCEDURE:  Katie Anthony is a 24 y.o. (DOB: 02-Dec-1995) Hispanic female whose primary care physician is Patient, No Pcp Per and comes for cholecystectomy.   The indications and risks of the gall bladder surgery were explained to the patient.  The risks include, but are not limited to, infection, bleeding, common bile duct injury and open surgery.  SURGERY:  The patient was taken to OR room #4 at [facility].  The abdomen was prepped with chloroprep.  The patient was given Rocephin/Cipro prior to the beginning of the operation.   A time out was held and the surgical checklist run.   An infraumbilical incision was made into the abdominal cavity.  A 12 mm Hasson trocar was inserted into the abdominal cavity through the infraumbilical incision and secured with a 0 Vicryl suture.  Three additional trocars were inserted: a 10 mm trocar in the sub-xiphoid location, a 5 mm trocar in the right mid subcostal area, and a 5 mm trocar in the right lateral subcostal area.   The abdomen was explored and the liver, stomach, and bowel that could be seen were unremarkable.   The gall bladder was minimally, if at all, inflamed.  There were some adhesions to the duodenum.   I grasped the gall bladder and rotated it cephalad.  Disssection was carried  down to the gall bladder/cystic duct junction and the cystic duct isolated.  A clip was placed on the gall bladder side of the cystic duct.   An intra-operative cholangiogram was shot.   The intra-operative cholangiogram was shot using a cut off Taut catheter placed through a 14 gauge angiocath in the RUQ.  The Taut catheter was inserted in the cut cystic duct and secured with an endoclip.  A cholangiogram was shot with 8 cc of 1/2 strength Isoview.  Using fluoroscopy, the cholangiogram showed the flow of contrast into the common bile duct, up the hepatic radicals, and into the duodenum.  There was no mass or obstruction.  This was a normal intra-operative cholangiogram.   The Taut catheter was removed.  The cystic duct was tripley endoclipped and the cystic artery was identified and clipped.  The gall bladder was bluntly and sharpley dissected from the gall bladder bed.   After the gall bladder was removed from the liver, the gall bladder bed and Triangle of Calot were inspected.  There was no bleeding or bile leak.  The gall bladder was placed in a Ecco Sac bag and delivered through the umbilicus.  The abdomen was irrigated with 1,000 cc saline.   The trocars were then removed.  I infiltrated 30cc of 1/4% Marcaine into the incisions.  The umbilical port closed with a 0 Vicryl suture and the skin closed with 4-0 Monocryl.  The skin was painted with DermaBond.  The patient's sponge  and needle count were correct.  The patient was transported to the RR in good condition.  Alphonsa Overall, MD, [facility] Surgery Pager: [phone] Office phone:  [phone]

## 2020-02-19 NOTE — Anesthesia Procedure Notes (Signed)
Procedure Name: Intubation Date/Time: 02/19/2020 1:32 PM Performed by: Thornell Mule, CRNA Pre-anesthesia Checklist: Patient identified, Emergency Drugs available, Suction available and Patient being monitored Patient Re-evaluated:Patient Re-evaluated prior to induction Oxygen Delivery Method: Circle system utilized Preoxygenation: Pre-oxygenation with 100% oxygen Induction Type: IV induction Ventilation: Mask ventilation without difficulty Laryngoscope Size: Miller and 3 Grade View: Grade I Tube type: Oral Tube size: 7.0 mm Number of attempts: 1 Airway Equipment and Method: Stylet and Oral airway Placement Confirmation: ETT inserted through vocal cords under direct vision,  positive ETCO2 and breath sounds checked- equal and bilateral Secured at: 20 cm Tube secured with: Tape Dental Injury: Teeth and Oropharynx as per pre-operative assessment

## 2020-02-19 NOTE — Transfer of Care (Signed)
Immediate Anesthesia Transfer of Care Note  Patient: Katie Anthony  Procedure(s) Performed: LAPAROSCOPIC CHOLECYSTECTOMY WITH INTRAOPERATIVE CHOLANGIOGRAM (N/A )  Patient Location: PACU  Anesthesia Type:General  Level of Consciousness: drowsy, patient cooperative and responds to stimulation  Airway & Oxygen Therapy: Patient Spontanous Breathing and Patient connected to face mask oxygen  Post-op Assessment: Report given to RN and Post -op Vital signs reviewed and stable  Post vital signs: Reviewed and stable  Last Vitals:  Vitals Value Taken Time  BP    Temp    Pulse 96 02/19/20 1503  Resp 18 02/19/20 1503  SpO2 100 % 02/19/20 1503  Vitals shown include unvalidated device data.  Last Pain:  Vitals:   02/19/20 0855  TempSrc: Oral  PainSc:       Patients Stated Pain Goal: 2 (02/19/20 0521)  Complications: No complications documented.

## 2020-02-19 NOTE — Anesthesia Preprocedure Evaluation (Addendum)
Anesthesia Evaluation  Patient identified by MRN, date of birth, ID band Patient awake    Reviewed: Allergy & Precautions, NPO status , Patient's Chart, lab work & pertinent test results  Airway Mallampati: II  TM Distance: >3 FB Neck ROM: Full    Dental no notable dental hx. (+) Teeth Intact, Dental Advisory Given   Pulmonary neg pulmonary ROS,    Pulmonary exam normal breath sounds clear to auscultation       Cardiovascular Exercise Tolerance: Good negative cardio ROS Normal cardiovascular exam Rhythm:Regular Rate:Normal     Neuro/Psych negative neurological ROS  negative psych ROS   GI/Hepatic Neg liver ROS, GERD  ,  Endo/Other  negative endocrine ROS  Renal/GU negative Renal ROS     Musculoskeletal   Abdominal   Peds  Hematology negative hematology ROS (+) Lab Results      Component                Value               Date                      WBC                      11.6 (H)            02/18/2020                HGB                      15.2 (H)            02/18/2020                HCT                      46.6 (H)            02/18/2020                MCV                      86.8                02/18/2020                PLT                      266                 02/18/2020              Anesthesia Other Findings   Reproductive/Obstetrics negative OB ROS                             Anesthesia Physical Anesthesia Plan  ASA: II  Anesthesia Plan: General   Post-op Pain Management:    Induction: Intravenous  PONV Risk Score and Plan: 3 and Treatment may vary due to age or medical condition, Ondansetron, Dexamethasone and Midazolam  Airway Management Planned: Oral ETT  Additional Equipment: None  Intra-op Plan:   Post-operative Plan: Extubation in OR  Informed Consent: I have reviewed the patients History and Physical, chart, labs and discussed the procedure including  the risks, benefits and alternatives for the proposed anesthesia with the patient or authorized representative who has indicated his/her understanding  and acceptance.     Dental advisory given  Plan Discussed with: CRNA and Surgeon  Anesthesia Plan Comments:         Anesthesia Quick Evaluation

## 2020-02-20 ENCOUNTER — Encounter (HOSPITAL_COMMUNITY): Payer: Self-pay | Admitting: Surgery

## 2020-02-20 ENCOUNTER — Other Ambulatory Visit: Payer: Self-pay

## 2020-02-20 LAB — SURGICAL PATHOLOGY

## 2020-02-20 MED ORDER — METHOCARBAMOL 500 MG PO TABS: 1000.0000 mg | ORAL_TABLET | Freq: Three times a day (TID) | ORAL | 0 refills | Status: DC

## 2020-02-20 MED ORDER — PROPOFOL 10 MG/ML IV BOLUS
INTRAVENOUS | Status: AC
  Filled 2020-02-20: qty 20

## 2020-02-20 MED ORDER — KETOROLAC TROMETHAMINE 30 MG/ML IJ SOLN
30.0000 mg | Freq: Three times a day (TID) | INTRAMUSCULAR | Status: DC
  Administered 2020-02-20: 30 mg via INTRAVENOUS
  Filled 2020-02-20: qty 1

## 2020-02-20 MED ORDER — ONDANSETRON 8 MG PO TBDP
8.0000 mg | ORAL_TABLET | Freq: Three times a day (TID) | ORAL | 0 refills | Status: DC | PRN
Start: 2020-02-20 — End: 2024-06-05

## 2020-02-20 MED ORDER — HYDROMORPHONE HCL 1 MG/ML IJ SOLN: 0.5000 mg | Freq: Three times a day (TID) | INTRAMUSCULAR | Status: DC | PRN

## 2020-02-20 MED ORDER — ACETAMINOPHEN 500 MG PO TABS: 1000.0000 mg | ORAL_TABLET | Freq: Four times a day (QID) | ORAL | 0 refills | Status: AC

## 2020-02-20 MED ORDER — IBUPROFEN 200 MG PO TABS: 600.0000 mg | ORAL_TABLET | Freq: Three times a day (TID) | ORAL | 0 refills | Status: DC | PRN

## 2020-02-20 MED ORDER — ACETAMINOPHEN 500 MG PO TABS
1000.0000 mg | ORAL_TABLET | Freq: Four times a day (QID) | ORAL | Status: DC
  Administered 2020-02-20: 10:00:00 1000 mg via ORAL
  Filled 2020-02-20: qty 2

## 2020-02-20 MED ORDER — OXYCODONE HCL 5 MG PO TABS
5.0000 mg | ORAL_TABLET | ORAL | Status: DC | PRN
  Administered 2020-02-20: 5 mg via ORAL
  Filled 2020-02-20: qty 1

## 2020-02-20 MED ORDER — METHOCARBAMOL 500 MG PO TABS
1000.0000 mg | ORAL_TABLET | Freq: Three times a day (TID) | ORAL | Status: DC
  Administered 2020-02-20: 10:00:00 1000 mg via ORAL
  Filled 2020-02-20: qty 2

## 2020-02-20 MED ORDER — OXYCODONE HCL 5 MG PO TABS: 5.0000 mg | ORAL_TABLET | Freq: Four times a day (QID) | ORAL | 0 refills | Status: DC | PRN

## 2020-02-20 NOTE — Discharge Summary (Addendum)
Patient ID: Katie Anthony [id number] 29-May-1996 24 y.o.  Admit date: 02/18/2020 Discharge date: 02/20/2020  Admitting Diagnosis: Biliary colic  Discharge Diagnosis Patient Active Problem List   Diagnosis Date Noted   Acute calculous cholecystitis 02/18/2020   Uses Spanish as primary spoken language 02/18/2020   Heartburn 02/18/2020   Single live birth 05/21/2019   LGSIL on Pap smear of cervix 10/29/2018   Recurrent epistaxis 10/31/2017   Anxiety and depression 10/10/2017   Acute anterior anal fissure 04/04/2017   Anemia 04/04/2017   Chlamydia infection 10/08/2014   Chronic constipation 10/02/2014   Adnexal tenderness 10/02/2014   Left lower quadrant pain 10/02/2014   Pain with bowel movements 10/02/2014   Generalized abdominal pain 10/02/2014   Straining during bowel movements 09/10/2013   Bulimia nervosa 09/09/2013   Blood in stool 09/09/2013   GERD (gastroesophageal reflux disease) 09/09/2013   Unspecified constipation 09/09/2013  s/p lap chole  Consultants none  Reason for Admission: Young woman that has been struggling with intermittent abdominal pain for some time.  Usually managed in the [facility] in the [place] region.  She has had intermittent abdominal pains.  Had complaints back in March and April.  Went to her primary care office.  Gallbladder etiology suspected.  Ultrasound ordered.  That did not happen.  She had similar complaints in May.  Had much more intense episodes of pain in the past week.  Had more intense pain.  Sent to the emergency room 2 days ago [facility].  Ultrasound done showed gallbladder sludge.  No definite cholecystitis.  Recommendation for close outpatient follow-up to see general surgery.  Patient and husband note that her pain never really went away and has become more intense and constant.  Based concerns came the emergency room this evening.  Bedside ultrasound without any obvious gallbladder wall  thickening but patient clearly has Eulah Pont sign and is having difficulty tolerating liquids and solids.  Surgical consultation requested.   Patient does not speak English great but has good comprehension.  Her husband is bilingual and is at bedside.  He feels comfortable being interpreter.  Somewhat challenged to tease out the history but patient definitely notes that she gets more intense pain in the right upper quadrant that will radiate to her shoulder.  She struggled with nausea and vomiting.  They try to adjust her diet without much help.  She does have a history of some heartburn and reflux but it is rather mild and this does not feel like that.  She does not smoke.  Rarely drinks alcohol.  She had an appendectomy done a few years ago but no other abdominal surgeries.  She normally moves her bowels about 3-4 times a day although other records note she is constipated.  She feels like her bowels have been more regular more recently.  No hematemesis or melena.  Strong family history of many people needing gallbladder removed.  No sick contacts.  No history of Crohn's or also colitis in the patient or family.  No dysphagia to solids or liquids.  Normally can walk 1/2-hour without difficulty.  She is clearly miserable with some fevers and chills now.  Procedures S/p lap chole with IOC by Dr. Ezzard Standing 7/22  Hospital Course:  The patient was admitted and underwent a laparoscopic cholecystectomy with IOC.  The patient tolerated the procedure well.  On POD 1, the patient was tolerating a regular diet, voiding well, mobilizing, and pain was controlled with oral pain medications.  The patient was stable for DC home at this time with appropriate follow up made.  Physical Exam: Abd: soft, appropriately tender, +BS, ND, incisions c/d/i  Allergies as of 02/20/2020       Reactions   Hydrocodone Shortness Of Breath, Anxiety   Latex Rash        Medication List     STOP taking these medications     HYDROcodone-acetaminophen 5-325 MG tablet Commonly known as: Norco   loperamide 2 MG capsule Commonly known as: IMODIUM       TAKE these medications    acetaminophen 500 MG tablet Commonly known as: TYLENOL Take 2 tablets (1,000 mg total) by mouth every 6 (six) hours.   ibuprofen 200 MG tablet Commonly known as: ADVIL Take 3-4 tablets (600-800 mg total) by mouth every 8 (eight) hours as needed for moderate pain. What changed:  how much to take when to take this Another medication with the same name was removed. Continue taking this medication, and follow the directions you see here.   methocarbamol 500 MG tablet Commonly known as: ROBAXIN Take 2 tablets (1,000 mg total) by mouth 3 (three) times daily.   ondansetron 8 MG disintegrating tablet Commonly known as: ZOFRAN-ODT Take 1 tablet (8 mg total) by mouth every 8 (eight) hours as needed for nausea. What changed: Another medication with the same name was removed. Continue taking this medication, and follow the directions you see here.   oxyCODONE 5 MG immediate release tablet Commonly known as: Oxy IR/ROXICODONE Take 1 tablet (5 mg total) by mouth every 6 (six) hours as needed for moderate pain.          Follow-up Information     [facility] Follow up on 03/09/2020.   Specialty: General Surgery Why: at 11:00 AM for post-operative follow up. please arrive by 10:30 AM to get checked in and fill out any necessary paperwork.  Contact information: [address] [place] [address] [phone]                 Signed: Barnetta Chapel, [facility] 02/20/2020, 1:26 PM Please see Amion for pager number during day hours 7:00am-4:30pm, 7-11:30am on Weekends  Agree with above.  Ovidio Kin, MD, [facility] Surgery Office phone:  [phone]

## 2020-02-20 NOTE — Discharge Instructions (Signed)
CIRUGIA LAPAROSCOPICA: INSTRUCCIONES DE POST OPERATORIO.  Revise siempre los documentos que le entreguen en el lugar donde se ha hecho la cirugia.  SI USTED NECESITA DOCUMENTOS DE INCAPACIDAD (DISABLE) O DE PERMISO FAMILAR (FAMILY LEAVE) NECESITA TRAERLOS A LA OFICINA PARA QUE SEAN PROCESADOS. NO  SE LOS DE A SU DOCTOR. 1. A su alta del hospital se le dara una receta para controlar el dolor. Tomela como ha sido recetada, si la necesita. Si no la necesita puede tomar, Acetaminofen (Tylenol) o Ibuprofen (Advil) para aliviar dolor moderado. 2. Continue tomando el resto de sus medicinas. 3. Si necesita rellenar la receta, llame a la farmacia. ellos contactan a nuestra oficina pidiendo autorizacion. Este tipo de receta no pueden ser rellenadas despues de las  5pm o durante los fines de semana. 4. Con relacion a la dieta: debe ser ligera los primeros dias despues que llege a la casa. Ejemplo: sopas y galleticas. Tome bastante liquido esos dias. 5. La mayoria de los pacientes padecen de inflamacion y cambio de coloracion de la piel alrededor de las incisiones. esto toma dias en resolver.  pnerse una bolsa de hielo en el area affectada ayuda..  6. Es comun tambien tener un poco de estrenimiento si esta tomado medicinas para el dolor. incremente la cantidad de liquidos a tomar y puede tomar (Colace) esto previene el problema. Si ya tiene estrenimiento, es decir no ha defecado en 48 horas, puede tomar un laxativo (Milk of Magnesia or Miralax) uselo como el paquete le explica. 7.  A menos que se le diga algo diferente. Remueva el bendaje a las 24-48 horas despues dela cirugia. y puede banarse en la ducha sin ningun problema. usted puede tener steri-strips (pequenas curitas transparentes en la piel puesta encima de la incision)  Estas banditas strips should be left on the skin for 7-10 days.   Si su cirujano puso pegamento encima de la incision usted puede banarse bajo la ducha en 24 horas. Este pegamento empezara a  caerse en las proximas 2-3 semanas. Si le pusieron suturas o presillas (grapos) estos seran quitados en su proxima cita en la oficina. . a. ACTIVIDADES:  Puede hacer actividad ligera.  Como caminar , subir escaleras y poco a poco irlas incrementando tanto como las tolere. Puede tener relaciones sexuales cuando sea comfortable. No carge objetos pesados o haga esfuerzos que no sean aprovados por su doctor. b. Puede manejar en cuanto no esta tomando medicamentos fuertes (narcoticos) para el dolor, pueda abrochar confortablemente el cinturon de seguridad, y pueda maniobrar y usar los pedales de su vehiculo con seguridad. c. PUEDE REGRESAR A TRABAJAR  8. Debe ver a su doctor para una cita de seguimiento en 2-3 semanas despues de la cirugia.  9. OTRAS ISNSTRUCCIONES:___________________________________________________________________________________ CUANDO LLAMAR A SU MEDICO: 1. FIEBRE mayor de  101.0 2. No produccion de orina. 3. Sangramiento continue de la herida 4. Incremento de dolor, enrojecimientio o drenaje de la herida (incision) 5. Incremento de dolor abdominal.  The clinic staff is available to answer your questions during regular business hours.  Please don't hesitate to call and ask to speak to one of the nurses for clinical concerns.  If you have a medical emergency, go to the nearest emergency room or call 911.  A surgeon from [facility] is always on call at the hospital. [address], [address], [place], [place]  [address] ? [address] [address], [place], [place]   [address] [phone] ? [phone] ? FAX [phone] Web site: [email or web address]  .........     Managing Your Pain After Surgery Without Opioids    Thank you for participating in our program to help patients manage their pain after surgery without opioids. This is part of our effort to provide you with the best care possible, without exposing you or your family to the risk that opioids  pose.  What pain can I expect after surgery? You can expect to have some pain after surgery. This is normal. The pain is typically worse the day after surgery, and quickly begins to get better. Many studies have found that many patients are able to manage their pain after surgery with Over-the-Counter (OTC) medications such as Tylenol and Motrin. If you have a condition that does not allow you to take Tylenol or Motrin, notify your surgical team.  How will I manage my pain? The best strategy for controlling your pain after surgery is around the clock pain control with Tylenol (acetaminophen) and Motrin (ibuprofen or Advil). Alternating these medications with each other allows you to maximize your pain control. In addition to Tylenol and Motrin, you can use heating pads or ice packs on your incisions to help reduce your pain.  How will I alternate your regular strength over-the-counter pain medication? You will take a dose of pain medication every three hours. ; Start by taking 650 mg of Tylenol (2 pills of 325 mg) ; 3 hours later take 600 mg of Motrin (3 pills of 200 mg) ; 3 hours after taking the Motrin take 650 mg of Tylenol ; 3 hours after that take 600 mg of Motrin.   - 1 -  See example - if your first dose of Tylenol is at 12:00 PM   12:00 PM Tylenol 650 mg (2 pills of 325 mg)  3:00 PM Motrin 600 mg (3 pills of 200 mg)  6:00 PM Tylenol 650 mg (2 pills of 325 mg)  9:00 PM Motrin 600 mg (3 pills of 200 mg)  Continue alternating every 3 hours   We recommend that you follow this schedule around-the-clock for at least 3 days after surgery, or until you feel that it is no longer needed. Use the table on the last page of this handout to keep track of the medications you are taking. Important: Do not take more than 3000mg of Tylenol or 3200mg of Motrin in a 24-hour period. Do not take ibuprofen/Motrin if you have a history of bleeding stomach ulcers, severe kidney disease, &/or actively  taking a blood thinner  What if I still have pain? If you have pain that is not controlled with the over-the-counter pain medications (Tylenol and Motrin or Advil) you might have what we call "breakthrough" pain. You will receive a prescription for a small amount of an opioid pain medication such as Oxycodone, Tramadol, or Tylenol with Codeine. Use these opioid pills in the first 24 hours after surgery if you have breakthrough pain. Do not take more than 1 pill every 4-6 hours.  If you still have uncontrolled pain after using all opioid pills, don't hesitate to call our staff using the number provided. We will help make sure you are managing your pain in the best way possible, and if necessary, we can provide a prescription for additional pain medication.   Day 1    Time  Name of Medication Number of pills taken  Amount of Acetaminophen  Pain Level   Comments  AM PM       AM PM       AM PM         AM PM       AM PM       AM PM       AM PM       AM PM       Total Daily amount of Acetaminophen Do not take more than  3,000 mg per day      Day 2    Time  Name of Medication Number of pills taken  Amount of Acetaminophen  Pain Level   Comments  AM PM       AM PM       AM PM       AM PM       AM PM       AM PM       AM PM       AM PM       Total Daily amount of Acetaminophen Do not take more than  3,000 mg per day      Day 3    Time  Name of Medication Number of pills taken  Amount of Acetaminophen  Pain Level   Comments  AM PM       AM PM       AM PM       AM PM          AM PM       AM PM       AM PM       AM PM       Total Daily amount of Acetaminophen Do not take more than  3,000 mg per day      Day 4    Time  Name of Medication Number of pills taken  Amount of Acetaminophen  Pain Level   Comments  AM PM       AM PM       AM PM       AM PM       AM PM       AM PM       AM PM       AM PM       Total Daily amount of Acetaminophen Do not  take more than  3,000 mg per day      Day 5    Time  Name of Medication Number of pills taken  Amount of Acetaminophen  Pain Level   Comments  AM PM       AM PM       AM PM       AM PM       AM PM       AM PM       AM PM       AM PM       Total Daily amount of Acetaminophen Do not take more than  3,000 mg per day       Day 6    Time  Name of Medication Number of pills taken  Amount of Acetaminophen  Pain Level  Comments  AM PM       AM PM       AM PM       AM PM       AM PM       AM PM       AM PM       AM PM       Total Daily amount of Acetaminophen Do not take more than    3,000 mg per day      Day 7    Time  Name of Medication Number of pills taken  Amount of Acetaminophen  Pain Level   Comments  AM PM       AM PM       AM PM       AM PM       AM PM       AM PM       AM PM       AM PM       Total Daily amount of Acetaminophen Do not take more than  3,000 mg per day        For additional information about how and where to safely dispose of unused opioid medications - https://www.morepowerfulnc.org  Disclaimer: This document contains information and/or instructional materials adapted from [facility] for the typical patient with your condition. It does not replace medical advice from your health care provider because your experience may differ from that of the typical patient. Talk to your health care provider if you have any questions about this document, your condition or your treatment plan. Adapted from [facility]   

## 2020-07-24 IMAGING — RF DG CHOLANGIOGRAM OPERATIVE
1 series · 4 of 4 positions shown · non-contrast
Comparison: None

CLINICAL DATA: Intraoperative cholangiogram during laparoscopic
cholecystectomy.

EXAM:
INTRAOPERATIVE CHOLANGIOGRAM
FLUOROSCOPY TIME:  10 seconds (3.1 mGy)

[Series 1: run · 4 of 60 frames shown]
[frame 6/60]
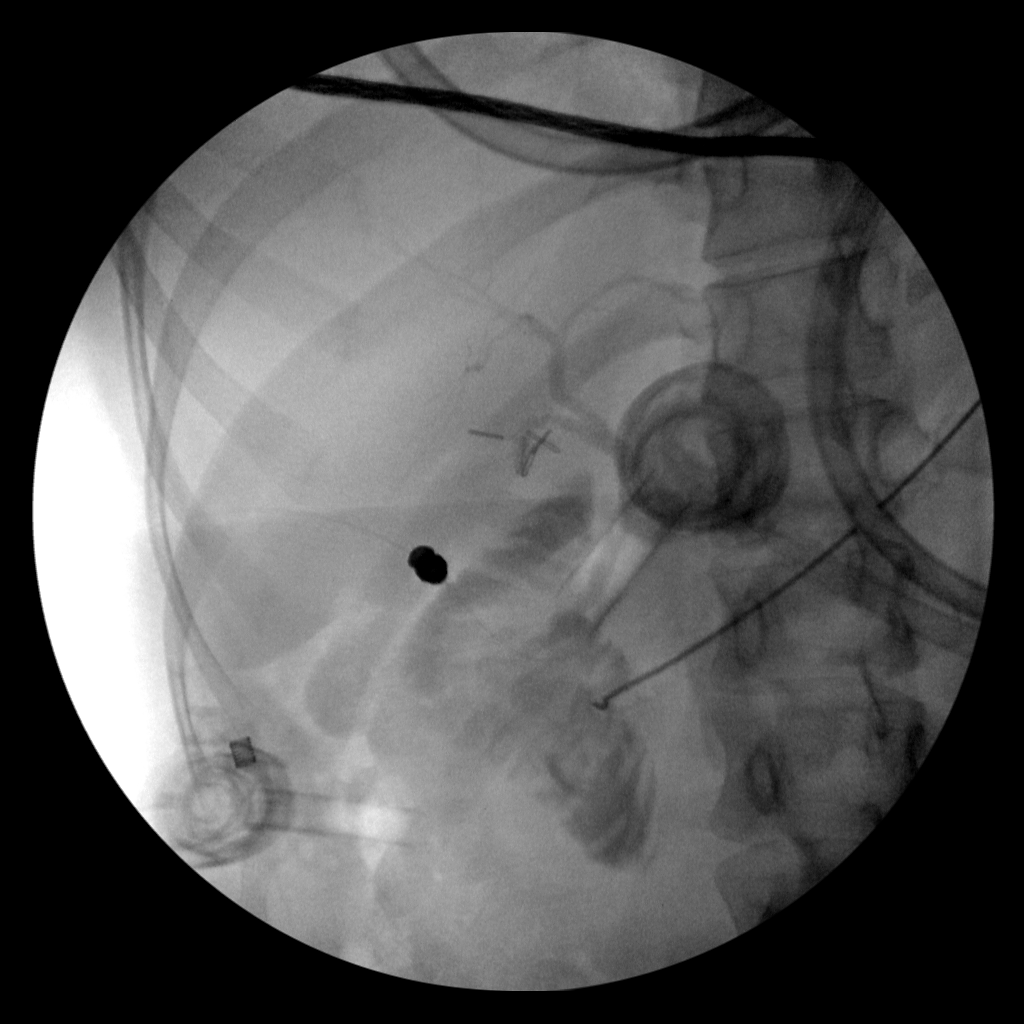
[frame 10/60]
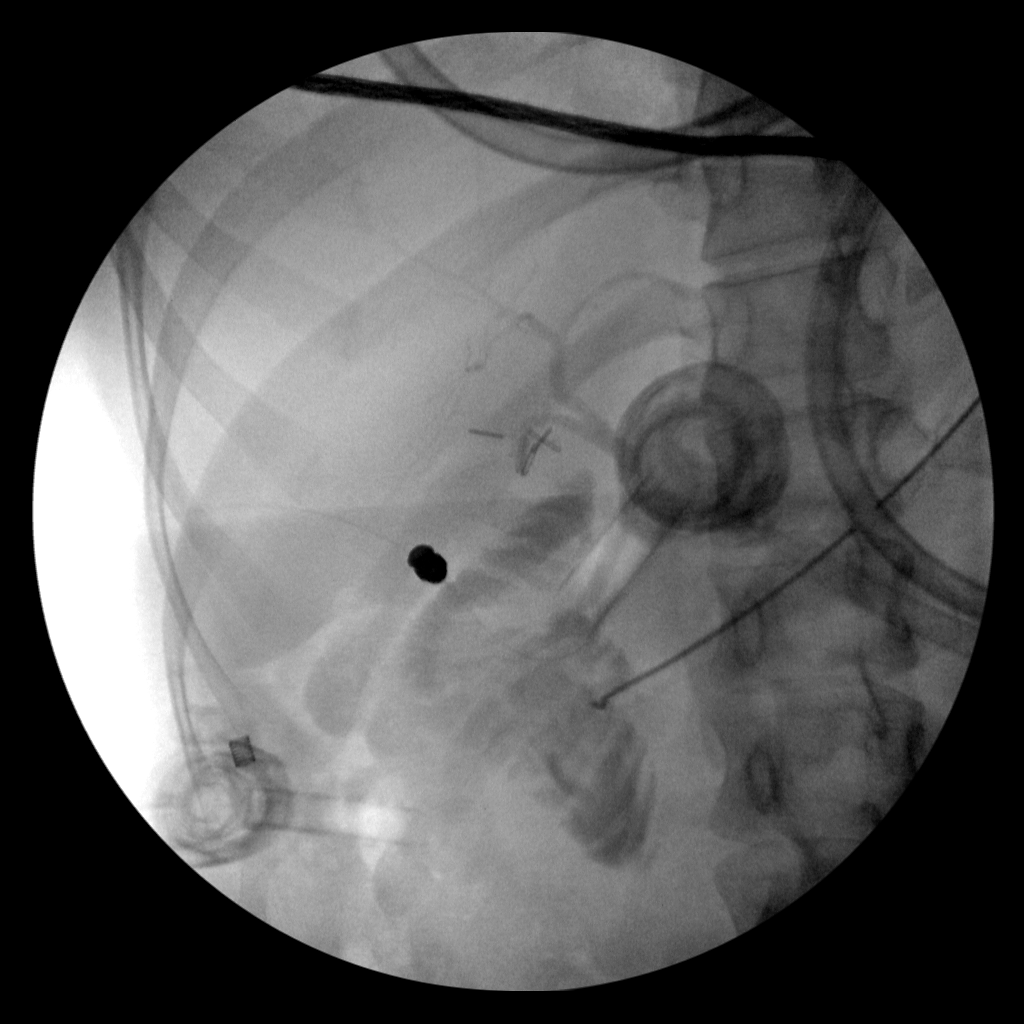
[frame 31/60]
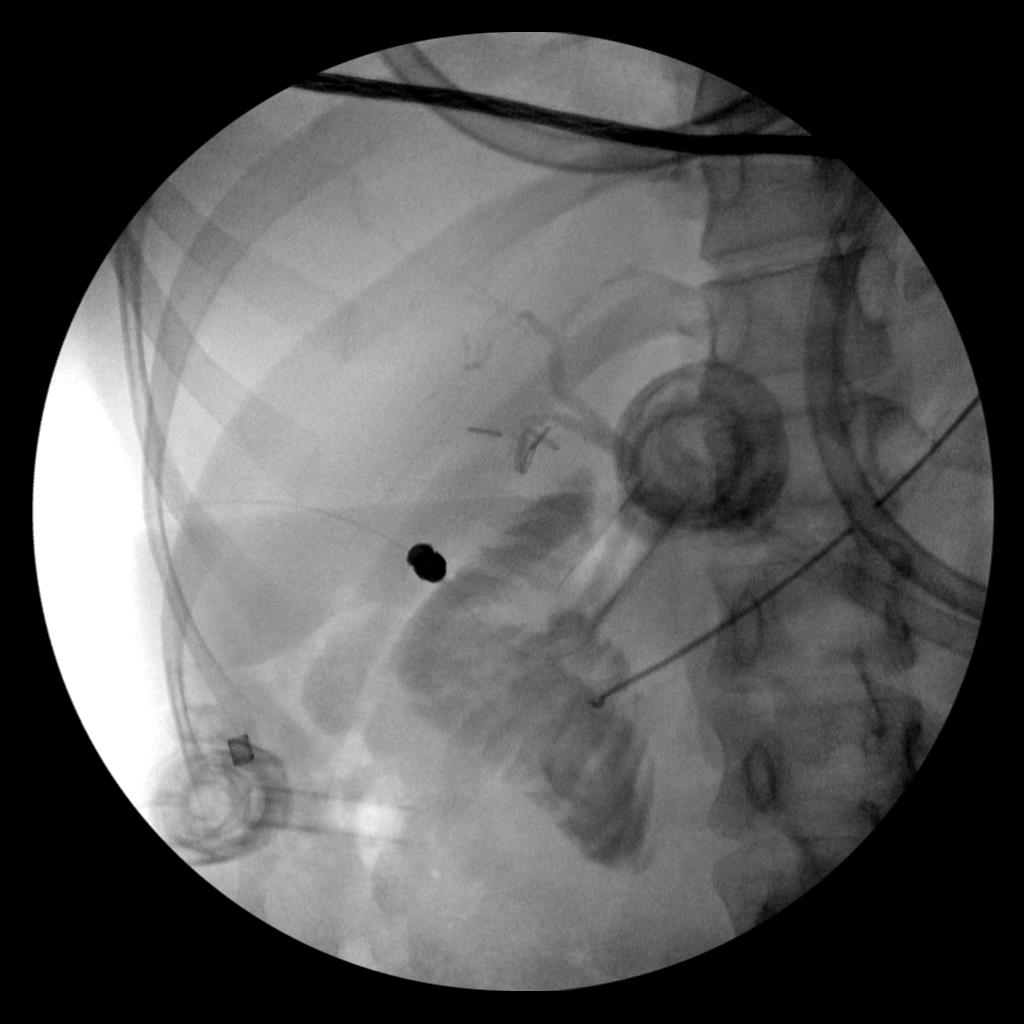
[frame 52/60]
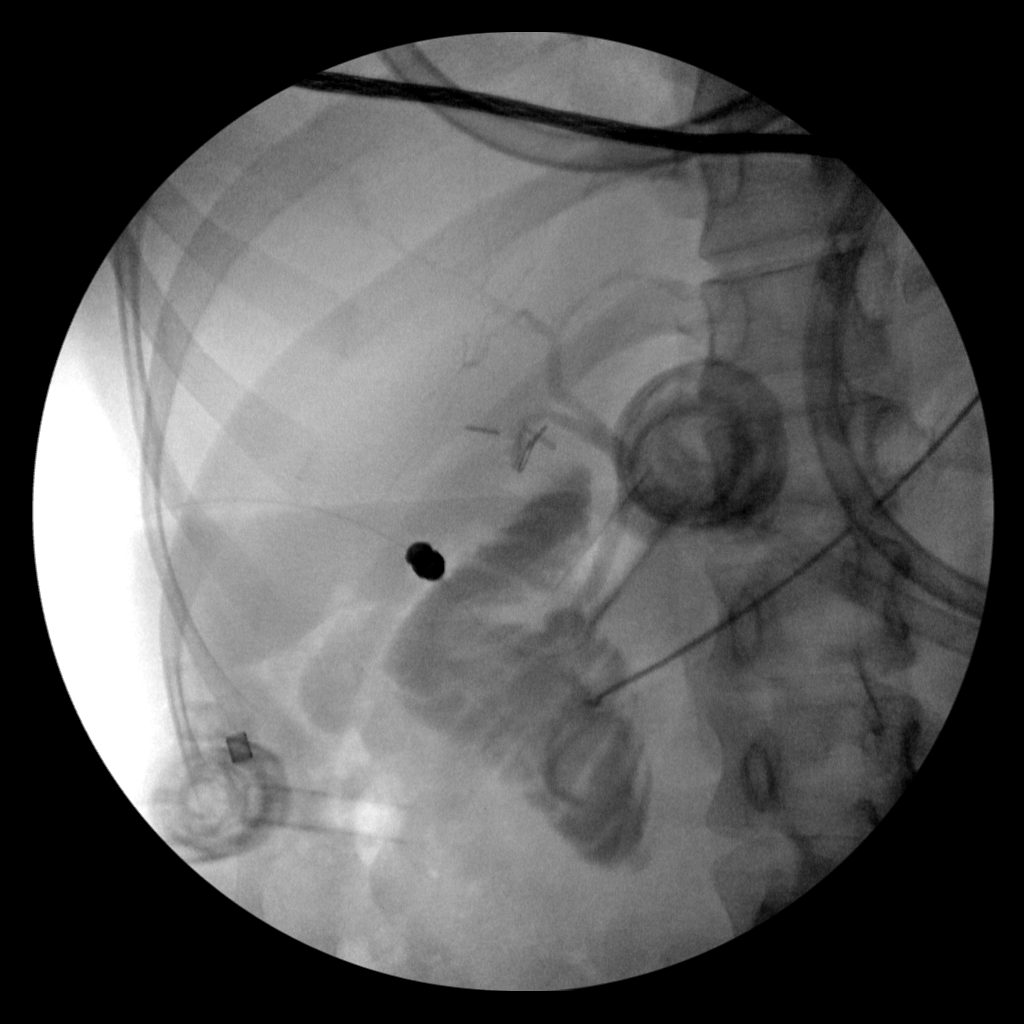

[4 of 4 positions shown; findings below may reference images not displayed]

FINDINGS: Intraoperative cholangiographic images of the right upper abdominal
quadrant during laparoscopic cholecystectomy are provided for
review.

Surgical clips overlie the expected location of the gallbladder
fossa.

Contrast injection demonstrates selective cannulation of the central
aspect of the cystic duct.

There is passage of contrast through the central aspect of the
cystic duct with filling of a non dilated common bile duct. There is
passage of contrast though the CBD and into the descending portion
of the duodenum.

There is minimal reflux of injected contrast into the common hepatic
duct and central aspect of the non dilated intrahepatic biliary
system.

There are no discrete filling defects within the opacified portions
of the biliary system to suggest the presence of
choledocholithiasis, though note, the distal most aspect of the CBD
is suboptimally visualized given contrast within an apparent
duodenal diverticulum.
IMPRESSION: No definite evidence of choledocholithiasis.

## 2023-05-02 ENCOUNTER — Encounter: Payer: Self-pay | Admitting: Gastroenterology

## 2023-05-02 ENCOUNTER — Ambulatory Visit (INDEPENDENT_AMBULATORY_CARE_PROVIDER_SITE_OTHER): Payer: Medicaid Other | Admitting: Gastroenterology

## 2023-05-02 VITALS — BP 126/70 | HR 101 | Ht 62.0 in | Wt 141.4 lb

## 2023-05-02 DIAGNOSIS — R1013 Epigastric pain: Secondary | ICD-10-CM

## 2023-05-02 DIAGNOSIS — R112 Nausea with vomiting, unspecified: Secondary | ICD-10-CM | POA: Diagnosis not present

## 2023-05-02 NOTE — Progress Notes (Unsigned)
HPI : Katie Anthony is a 27 y.o. female with a history of endometriosis and chronic pelvic pain and history of cholecystectomy who is referred to [place] by Macy Mis, MD for further evaluation of abdominal pain.  She is accompanied by a Spanish language interpreter in clinic today.  She has been seen multiple times by her primary care in the last month for this pain.  Workup thus far has included H. pylori breath test (not completed by patient 'because of vomiting')and a right upper quadrant ultrasound as well as liver enzymes and lipase.  She has been treated with omeprazole twice daily and Zofran.   Right upper quadrant ultrasound on September 22 showed surgically absent gallbladder, normal appearing bile duct and a 7 mm hyperechoic focus in the right lobe of the liver.  This lesion was felt to be most likely a hemangioma but repeat ultrasound was recommended in 6 months to ensure stability.  The patient reports that she has had problems with abdominal pain in the past.  She tells me that she was hospitalized while living in [place] at the age of 22.  She was in the hospital for 2 months because she was unable to tolerate p.o. and was given IV nutrition (TPN).  She is not aware of the workup that was done, but states that she did not undergo an endoscopy.  She states that the symptoms she has been having for the past few weeks are similar to her symptoms back then.  She describes stabbing pain in the RUQ/epigastrium.  Feels like 'stones falling down' in her stomach.  Pain radiates into back and chest. She was vomiting a lot, but the Zofran has helped with this quite a bit.  She states that she would vomit on an empty stomach as well as after eating.  She reports seeing streaks of blood in her vomit on a few occasions.  She feels nauseated most of the time.  She has very little appetite.  She has lost 5 pounds.  She is eating a very bland diet currently, but is able to keep food down. She had  been taking Advil on occasion, but stopped taking it completely 2 weeks ago. She also reports lots of heartburn and acid regurgitation.  She has these symptoms on a daily basis, but omeprazole has helped reduce the frequency and severity of the symptoms..  She states that ever since she had her gallbladder removed, she has had issues with diarrhea.  This has not changed recently.  The patient states that she had problems with constipation and anorexia as a child.  The patient denies marijuana use or alcohol use.   RUQUS April 22, 2023 IMPRESSION: Small hyperechoic focus right lobe liver. This likely represents a benign process such as small hemangioma, but would recommend follow-up ultrasound in 6 months to evaluate stability.   Past Medical History:  Diagnosis Date   Appendicitis    Constipation    Endometriosis    Pelvic pain      Past Surgical History:  Procedure Laterality Date   APPENDECTOMY     CHOLECYSTECTOMY N/A 02/19/2020   Procedure: LAPAROSCOPIC CHOLECYSTECTOMY WITH INTRAOPERATIVE CHOLANGIOGRAM;  Surgeon: Ovidio Kin, MD;  Location: [facility];  Service: General;  Laterality: N/A;   Family History  Problem Relation Age of Onset   Hyperlipidemia Father    Hypertension Father    Diabetes Father    Social History   Tobacco Use   Smoking status: Never   Smokeless tobacco:  Never  Vaping Use   Vaping status: Never Used  Substance Use Topics   Alcohol use: No   Drug use: No   Current Outpatient Medications  Medication Sig Dispense Refill   acetaminophen (TYLENOL) 500 MG tablet Take 2 tablets (1,000 mg total) by mouth every 6 (six) hours. 30 tablet 0   ibuprofen (ADVIL) 200 MG tablet Take 3-4 tablets (600-800 mg total) by mouth every 8 (eight) hours as needed for moderate pain. 30 tablet 0   methocarbamol (ROBAXIN) 500 MG tablet Take 2 tablets (1,000 mg total) by mouth 3 (three) times daily. 20 tablet 0   ondansetron (ZOFRAN-ODT) 8 MG disintegrating tablet Take 1  tablet (8 mg total) by mouth every 8 (eight) hours as needed for nausea. 15 tablet 0   oxyCODONE (OXY IR/ROXICODONE) 5 MG immediate release tablet Take 1 tablet (5 mg total) by mouth every 6 (six) hours as needed for moderate pain. 20 tablet 0   No current facility-administered medications for this visit.   Allergies  Allergen Reactions   Hydrocodone Shortness Of Breath and Anxiety   Latex Rash     Review of Systems: All systems reviewed and negative except where noted in HPI.    No results found.  Physical Exam: BP 126/70   Pulse (!) 101   Ht 5\' 2"  (1.575 m)   Wt 141 lb 6.4 oz (64.1 kg)   SpO2 97%   BMI 25.86 kg/m  Constitutional: Pleasant,well-developed, Hispanic female in no acute distress.  Accompanied by Spanish language interpreter HEENT: Normocephalic and atraumatic. Conjunctivae are normal. No scleral icterus. Neck supple.  Cardiovascular: Normal rate, regular rhythm.  Pulmonary/chest: Effort normal and breath sounds normal. No wheezing, rales or rhonchi. Abdominal: Soft, nondistended, multifocal tenderness to palpation throughout the abdomen without rigidity or guarding. Bowel sounds active throughout. There are no masses palpable. No hepatomegaly. Extremities: no edema Neurological: Alert and oriented to person place and time. Skin: Skin is warm and dry. No rashes noted. Psychiatric: Normal mood and affect. Behavior is normal.  CBC    Component Value Date/Time   WBC 11.6 (H) 02/18/2020 1611   RBC 5.37 (H) 02/18/2020 1611   HGB 15.2 (H) 02/18/2020 1611   HGB 13.5 10/11/2017 1635   HCT 46.6 (H) 02/18/2020 1611   HCT 41.4 10/11/2017 1635   PLT 266 02/18/2020 1611   PLT 248 10/11/2017 1635   MCV 86.8 02/18/2020 1611   MCV 89 10/11/2017 1635   MCH 28.3 02/18/2020 1611   MCHC 32.6 02/18/2020 1611   RDW 13.5 02/18/2020 1611   RDW 13.0 10/11/2017 1635   LYMPHSABS 0.5 (L) 10/03/2018 0740   MONOABS 1.1 (H) 10/03/2018 0740   EOSABS 0.0 10/03/2018 0740    BASOSABS 0.0 10/03/2018 0740    CMP     Component Value Date/Time   NA 139 02/18/2020 1611   NA 137 10/11/2017 1635   K 3.6 02/18/2020 1611   CL 103 02/18/2020 1611   CO2 25 02/18/2020 1611   GLUCOSE 79 02/18/2020 1611   BUN 9 02/18/2020 1611   BUN 11 10/11/2017 1635   CREATININE 0.54 02/18/2020 1611   CREATININE 0.76 10/02/2014 1657   CALCIUM 9.2 02/18/2020 1611   PROT 8.5 (H) 02/18/2020 1611   PROT 8.4 10/11/2017 1635   ALBUMIN 4.5 02/18/2020 1611   ALBUMIN 4.9 10/11/2017 1635   AST 20 02/18/2020 1611   ALT 39 02/18/2020 1611   ALKPHOS 74 02/18/2020 1611   BILITOT 0.9 02/18/2020 1611   BILITOT  1.8 (H) 10/11/2017 1635   GFRNONAA >60 02/18/2020 1611   GFRNONAA >89 10/02/2014 1657   GFRAA >60 02/18/2020 1611   GFRAA >89 10/02/2014 1657       Latest Ref Rng & Units 02/18/2020    4:11 PM 10/03/2018    7:40 AM 10/11/2017    4:35 PM  CBC EXTENDED  WBC 4.0 - 10.5 K/uL 11.6  10.0  8.4   RBC 3.87 - 5.11 MIL/uL 5.37  4.13  4.66   Hemoglobin 12.0 - 15.0 g/dL 40.1  02.7  25.3   HCT 36.0 - 46.0 % 46.6  37.7  41.4   Platelets 150 - 400 K/uL 266  200  248   NEUT# 1.7 - 7.7 K/uL  8.3    Lymph# 0.7 - 4.0 K/uL  0.5        ASSESSMENT AND PLAN:  27 year old female with several weeks of epigastric pain, nausea and vomiting with partial improvement with PPI and Zofran.  Interestingly, she reports a history of severe p.o. intolerance as a teenager requiring a 46-month hospitalization and initiation of TPN.  She also reports a history of anorexia.  Ultrasound negative for gallstones.  She does report a history of Advil use, but not regularly. She also has a history of frequent GERD symptoms which are also partially improved with omeprazole. I recommend an EGD to evaluate for peptic ulcer disease, pyloric stenosis and further assess for H. pylori.  Recommend she continue twice daily omeprazole and as needed Zofran for now.  Continue to completely avoid all NSAIDs and follow bland diet until  her symptoms are better. If EGD is completely normal and patient symptoms continue, this would be more suggestive of a functional disorder, especially given her history of eating disorder in the past.  A HIDA scan could be considered to further exclude gallbladder etiology as a source of her symptoms prior to treating for gut brain axis disorder.  Epigastric pain, nausea/vomiting - EGD - Continue BID omeprazole, zofran - Avoid NSAIDs - Follow bland diet  GERD - EGD - Continue BID omeprazole, zofran  The details, risks (including bleeding, perforation, infection, missed lesions, medication reactions and possible hospitalization or surgery if complications occur), benefits, and alternatives to EGD with possible biopsy and possible dilation were discussed with the patient and she consents to proceed.   Demetric Dunnaway E. Tomasa Rand, MD [facility]   I spent a total of 45 minutes reviewing the patient's medical record, interviewing and examining the patient, discussing her diagnosis and management of her condition going forward, and documenting in the medical record     [place], Sharrie Rothman, MD

## 2023-05-02 NOTE — Patient Instructions (Signed)
You have been scheduled for an endoscopy. Please follow written instructions given to you at your visit today.  If you use inhalers (even only as needed), please bring them with you on the day of your procedure.  If you take any of the following medications, they will need to be adjusted prior to your procedure:   DO NOT TAKE 7 DAYS PRIOR TO TEST- Trulicity (dulaglutide) Ozempic, Wegovy (semaglutide) Mounjaro (tirzepatide) Bydureon Bcise (exanatide extended release)  DO NOT TAKE 1 DAY PRIOR TO YOUR TEST Rybelsus (semaglutide) Adlyxin (lixisenatide) Victoza (liraglutide) Byetta (exanatide) ___________________________________________________________________________     If your blood pressure at your visit was 140/90 or greater, please contact your primary care physician to follow up on this.  _______________________________________________________  If you are age 28 or older, your body mass index should be between 23-30. Your Body mass index is 25.86 kg/m. If this is out of the aforementioned range listed, please consider follow up with your Primary Care Provider.  If you are age 23 or younger, your body mass index should be between 19-25. Your Body mass index is 25.86 kg/m. If this is out of the aformentioned range listed, please consider follow up with your Primary Care Provider.   ________________________________________________________  The [facility] providers would like to encourage you to use [facility] to communicate with providers for non-urgent requests or questions.  Due to long hold times on the telephone, sending your provider a message by [facility] may be a faster and more efficient way to get a response.  Please allow 48 business hours for a response.  Please remember that this is for non-urgent requests.   It was a pleasure to see you today!  Thank you for trusting me with your gastrointestinal care!    Scott E.Tomasa Rand, MD

## 2023-05-23 ENCOUNTER — Encounter: Payer: Self-pay | Admitting: Gastroenterology

## 2023-05-23 ENCOUNTER — Ambulatory Visit: Payer: Medicaid Other | Admitting: Gastroenterology

## 2023-05-23 VITALS — BP 97/64 | HR 77 | Temp 97.3°F | Resp 12 | Ht 62.0 in | Wt 141.0 lb

## 2023-05-23 DIAGNOSIS — K319 Disease of stomach and duodenum, unspecified: Secondary | ICD-10-CM | POA: Diagnosis not present

## 2023-05-23 DIAGNOSIS — K2970 Gastritis, unspecified, without bleeding: Secondary | ICD-10-CM | POA: Diagnosis not present

## 2023-05-23 DIAGNOSIS — R1013 Epigastric pain: Secondary | ICD-10-CM

## 2023-05-23 DIAGNOSIS — R112 Nausea with vomiting, unspecified: Secondary | ICD-10-CM

## 2023-05-23 MED ORDER — SODIUM CHLORIDE 0.9 % IV SOLN: 500.0000 mL | INTRAVENOUS | Status: DC

## 2023-05-23 NOTE — Op Note (Signed)
[facility] Patient Name: Katie Anthony Procedure Date: 05/23/2023 9:46 AM MRN: [id number] Endoscopist: Lorin Picket E. Tomasa Rand , MD, [id number] Age: 27 Referring MD:  Date of Birth: [birth year] Gender: Female Account #: [id number] Procedure:                Upper GI endoscopy Indications:              Epigastric abdominal pain, Nausea with vomiting Medicines:                Monitored Anesthesia Care Procedure:                Pre-Anesthesia Assessment:                           - Prior to the procedure, a History and Physical                            was performed, and patient medications and                            allergies were reviewed. The patient's tolerance of                            previous anesthesia was also reviewed. The risks                            and benefits of the procedure and the sedation                            options and risks were discussed with the patient.                            All questions were answered, and informed consent                            was obtained. Prior Anticoagulants: The patient has                            taken no anticoagulant or antiplatelet agents. ASA                            Grade Assessment: I - A normal, healthy patient.                            After reviewing the risks and benefits, the patient                            was deemed in satisfactory condition to undergo the                            procedure.                           After obtaining informed consent, the endoscope was  passed under direct vision. Throughout the                            procedure, the patient's blood pressure, pulse, and                            oxygen saturations were monitored continuously. The                            Olympus Scope [id number] was introduced through the                            mouth, and advanced to the third part of duodenum.                             The upper GI endoscopy was accomplished without                            difficulty. The patient tolerated the procedure                            well. Scope In: Scope Out: Findings:                 The examined portions of the nasopharynx,                            oropharynx and larynx were normal.                           Diffuse mild mucosal changes characterized by                            congestion were found in the entire esophagus.                            Biopsies were taken with a cold forceps for                            histology. Estimated blood loss was minimal.                           The exam of the esophagus was otherwise normal.                           Scattered mild inflammation characterized by                            adherent blood and erythema was found in the                            gastric body. There was some bilious fluid and                            copious adherent mucus in the stomach  as well.                            Biopsies were taken with a cold forceps for                            Helicobacter pylori testing.                           The exam of the stomach was otherwise normal.                           The examined duodenum was normal. Biopsies for                            histology were taken with a cold forceps for                            evaluation of celiac disease. Estimated blood loss                            was minimal. Complications:            No immediate complications. Estimated Blood Loss:     Estimated blood loss was minimal. Impression:               - The examined portions of the nasopharynx,                            oropharynx and larynx were normal.                           - Congested mucosa in the esophagus. Biopsied.                           - Gastritis. Biopsied.                           - Normal examined duodenum. Biopsied. Recommendation:           - Patient has a contact number  available for                            emergencies. The signs and symptoms of potential                            delayed complications were discussed with the                            patient. Return to normal activities tomorrow.                            Written discharge instructions were provided to the                            patient.                           -  Resume previous diet.                           - Continue present medications.                           - Await pathology results.                           - Further recommendations will be based on                            pathology results. Lionel Woodberry E. Tomasa Rand, MD 05/23/2023 10:13:39 AM This report has been signed electronically.

## 2023-05-23 NOTE — Progress Notes (Signed)
Interpreter, [employer], present during recovery.

## 2023-05-23 NOTE — Patient Instructions (Addendum)
Resume previous diet. Continue present medications. Await pathology results. Further recommendations will be based on pathology results.  YOU HAD AN ENDOSCOPIC PROCEDURE TODAY AT THE [facility]:   Refer to the procedure report that was given to you for any specific questions about what was found during the examination.  If the procedure report does not answer your questions, please call your gastroenterologist to clarify.  If you requested that your care partner not be given the details of your procedure findings, then the procedure report has been included in a sealed envelope for you to review at your convenience later.  YOU SHOULD EXPECT: Some feelings of bloating in the abdomen. Passage of more gas than usual.  Walking can help get rid of the air that was put into your GI tract during the procedure and reduce the bloating. If you had a lower endoscopy (such as a colonoscopy or flexible sigmoidoscopy) you may notice spotting of blood in your stool or on the toilet paper. If you underwent a bowel prep for your procedure, you may not have a normal bowel movement for a few days.  Please Note:  You might notice some irritation and congestion in your nose or some drainage.  This is from the oxygen used during your procedure.  There is no need for concern and it should clear up in a day or so.  SYMPTOMS TO REPORT IMMEDIATELY:  Following upper endoscopy (EGD)  Vomiting of blood or coffee ground material  New chest pain or pain under the shoulder blades  Painful or persistently difficult swallowing  New shortness of breath  Fever of 100F or higher  Black, tarry-looking stools  For urgent or emergent issues, a gastroenterologist can be reached at any hour by calling [phone]. Do not use MyChart messaging for urgent concerns.    DIET:  We do recommend a small meal at first, but then you may proceed to your regular diet.  Drink plenty of fluids but you should avoid alcoholic  beverages for 24 hours.  ACTIVITY:  You should plan to take it easy for the rest of today and you should NOT DRIVE or use heavy machinery until tomorrow (because of the sedation medicines used during the test).    FOLLOW UP: Our staff will call the number listed on your records the next business day following your procedure.  We will call around 7:15- 8:00 am to check on you and address any questions or concerns that you may have regarding the information given to you following your procedure. If we do not reach you, we will leave a message.     If any biopsies were taken you will be contacted by phone or by letter within the next 1-3 weeks.  Please call us at [phone] if you have not heard about the biopsies in 3 weeks.    SIGNATURES/CONFIDENTIALITY: You and/or your care partner have signed paperwork which will be entered into your electronic medical record.  These signatures attest to the fact that that the information above on your After Visit Summary has been reviewed and is understood.  Full responsibility of the confidentiality of this discharge information lies with you and/or your care-partner. 

## 2023-05-23 NOTE — Progress Notes (Signed)
History and Physical Interval Note:  05/23/2023 9:47 AM  Katie Anthony  has presented today for endoscopic procedure(s), with the diagnosis of  Encounter Diagnoses  Name Primary?   Abdominal pain, epigastric Yes   Nausea and vomiting, unspecified vomiting type   .  The various methods of evaluation and treatment have been discussed with the patient and/or family. After consideration of risks, benefits and other options for treatment, the patient has consented to  the endoscopic procedure(s).   The patient's history has been reviewed, patient examined, no change in status, stable for endoscopic procedure(s).  I have reviewed the patient's chart and labs.  Questions were answered to the patient's satisfaction.     Trea Carnegie E. Tomasa Rand, MD [facility]

## 2023-05-23 NOTE — Progress Notes (Signed)
Uneventful anesthetic. Report to pacu rn. Vss. Care resumed by rn. 

## 2023-05-23 NOTE — Progress Notes (Signed)
Called to room to assist during endoscopic procedure.  Patient ID and intended procedure confirmed with present staff. Received instructions for my participation in the procedure from the performing physician.  

## 2023-05-24 ENCOUNTER — Telehealth: Payer: Self-pay

## 2023-05-24 NOTE — Telephone Encounter (Signed)
Follow up call placed, busy signal obtained. 

## 2023-05-25 LAB — SURGICAL PATHOLOGY

## 2023-05-31 ENCOUNTER — Other Ambulatory Visit: Payer: Self-pay

## 2023-05-31 DIAGNOSIS — R112 Nausea with vomiting, unspecified: Secondary | ICD-10-CM

## 2023-05-31 DIAGNOSIS — R1013 Epigastric pain: Secondary | ICD-10-CM

## 2023-05-31 NOTE — Progress Notes (Signed)
Katie Anthony,  The biopsies taken from your stomach (antrum) were notable for mild reactive gastropathy which is a common finding and often related to use of certain medications (usually NSAIDs), but there was no evidence of Helicobacter pylori infection. This common finding is not felt to necessarily be a cause of any particular symptom and there is no specific treatment or further evaluation recommended.  The biopsies taken from you duodenum were unremarkable, with no evidence of celiac disease  The biopsies of your esophagus were normal, with no changes suggestive of acid reflux and no evidence of eosinophilic esophagitis.  Often times, symptoms of stress and anxiety can manifest through the gastrointestinal tract.  This may be the case with your symptoms.   Although unlikely, sometimes there can be problems with the emptying of the stomach (gastroparesis) which can cause symptoms similar to yours.  We can evaluate for gastroparesis with a gastric emptying study.   This is a radiology study that assesses the time it takes for the stomach to empty properly. We will order this study for you.  Please follow up me in the office after this study is complete.  [name],  Can you please order a gastric emptying study (sx nausea, vomiting, abd pain) and make a follow up visit?

## 2023-06-20 ENCOUNTER — Ambulatory Visit: Payer: Medicaid Other | Admitting: Gastroenterology

## 2023-08-29 ENCOUNTER — Ambulatory Visit: Payer: Medicaid Other | Admitting: Gastroenterology

## 2024-06-05 ENCOUNTER — Ambulatory Visit: Admitting: Radiology

## 2024-06-05 ENCOUNTER — Ambulatory Visit
Admission: EM | Admit: 2024-06-05 | Discharge: 2024-06-05 | Disposition: A | Discharge status: Home / Self Care | Attending: Emergency Medicine | Admitting: Emergency Medicine

## 2024-06-05 ENCOUNTER — Other Ambulatory Visit: Payer: Self-pay

## 2024-06-05 DIAGNOSIS — M79601 Pain in right arm: Secondary | ICD-10-CM | POA: Diagnosis not present

## 2024-06-05 MED ORDER — IBUPROFEN 800 MG PO TABS
800.0000 mg | ORAL_TABLET | Freq: Once | ORAL | Status: AC
  Administered 2024-06-05: 800 mg via ORAL

## 2024-06-05 MED ORDER — IBUPROFEN 800 MG PO TABS: 800.0000 mg | ORAL_TABLET | Freq: Three times a day (TID) | ORAL | 0 refills | Status: AC

## 2024-06-05 MED ORDER — CYCLOBENZAPRINE HCL 10 MG PO TABS: 10.0000 mg | ORAL_TABLET | Freq: Two times a day (BID) | ORAL | 0 refills | Status: AC | PRN

## 2024-06-05 NOTE — Discharge Instructions (Addendum)
 Xray does not show any broken bones You may have sprains which are soft tissue injuries They can take several days to weeks to heal You can use ibuprofen  and tylenol  for pain You can take the muscle relaxer twice daily. If the medication makes you drowsy, take only at bed time. Apply ice to the areas of pain You can use the ace wrap for support and compression  Please call your primary care provider for a follow up appointment Please go to the emergency department if symptoms worsen.  La radiografa no muestra fracturas. Es posible que tenga esguinces, que son lesiones de tejidos blandos. La recuperacin puede tardar de principal financial a semanas. Puede tomar ibuprofeno y paracetamol para el dolor. Puede tomar art therapist veces al da. Si el medicamento le produce somnolencia, tmelo solo antes de [place]. Aplique hielo en las zonas doloridas. Puede usar una venda elstica para dar soporte y compresin. Llame a su mdico de cabecera para programar una cita de seguimiento. Si los sntomas empeoran, acuda al departamento de associate professor.
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
## 2024-08-19 ENCOUNTER — Ambulatory Visit
Admission: EM | Admit: 2024-08-19 | Discharge: 2024-08-19 | Disposition: A | Discharge status: Home / Self Care | Attending: Physician Assistant | Admitting: Physician Assistant

## 2024-08-19 ENCOUNTER — Other Ambulatory Visit: Payer: Self-pay

## 2024-08-19 DIAGNOSIS — R112 Nausea with vomiting, unspecified: Secondary | ICD-10-CM

## 2024-08-19 DIAGNOSIS — R197 Diarrhea, unspecified: Secondary | ICD-10-CM | POA: Diagnosis not present

## 2024-08-19 LAB — POCT INFLUENZA A/B
Influenza A, POC: NEGATIVE
Influenza B, POC: NEGATIVE

## 2024-08-19 MED ORDER — DIPHENHYDRAMINE HCL 50 MG/ML IJ SOLN
50.0000 mg | Freq: Once | INTRAMUSCULAR | Status: AC
  Administered 2024-08-19: 50 mg via INTRAMUSCULAR

## 2024-08-19 NOTE — Discharge Instructions (Signed)
 You were seen today for concerns of persistent nausea, vomiting and diarrhea.  To help with your symptoms we administered a shot of Benadryl  50 mg.  This should help reduce the nausea and vomiting that you are experiencing.  For the next 24 hours I recommend that you do not take the vitamin B6 and Unisom.  If you are taking Benadryl  at home please do not take the Unisom as this can lead to an overdose of antihistamine medications. I recommend implementing a bland diet for the next few days with small portions to help prevent GI upset.  Please make sure that you are increasing your liquid intake to help prevent dehydration.  We have supplied you with a list of medications that are safe to take during pregnancy.  I have highlighted a few that should help with your current symptoms. If you feel like your symptoms are getting worse, you are unable to eat or drink due to nausea and vomiting, you feel like you are getting dehydrated, you are having chest pain, difficulty breathing, fevers that are not responding to Tylenol  and ibuprofen , loss of consciousness or confusion please go to the ER.

## 2024-08-19 NOTE — ED Triage Notes (Addendum)
 Pt presents with c/o generalized body aches, vomiting, diarrhea, heartburn, fatigue, and headaches. Day three of symptoms. States she is approximately six weeks pregnant. Only medications taken was Tums PTA for heartburn. Vomits directly after taking. Currently rates overall body pain a 10/10. Feels achy all over. Spouse was sick with similar symptoms last week.

## 2024-08-19 NOTE — ED Notes (Signed)
 This RN had a discussion with provider at this time. Reminded her that pt was six weeks pregnant. States she is aware and IM Benadryl  is appropriate to administer. Spouse and pt state she is unable to keep oral medications down.

## 2024-08-19 NOTE — ED Provider Notes (Signed)
 " [facility]    CSN: [id number] Arrival date & time: 08/19/24  1447      History   Chief Complaint Chief Complaint  Patient presents with   Generalized Body Aches    HPI Katie Anthony is a 29 y.o. female.   HPI   Pt is here today with concerns for nausea, vomiting, heartburn, body aches, diarrhea. She states this started 2 days ago. She states she is about [redacted] weeks pregnant.  She reports that her husband was sick with similar symptoms prior to hers starting. One of her coworkers was sick recently as well.   Interventions: She has been taking her prenatal vitamin, B6 and Unisom but this is not providing much relief. Her husband states that anytime she tries to eat or drink, she is unable to keep down for more than 5 minutes.      Past Medical History:  Diagnosis Date   Allergy    Appendicitis    Constipation    Endometriosis    GERD (gastroesophageal reflux disease)    Pelvic pain     Patient Active Problem List   Diagnosis Date Noted   Acute calculous cholecystitis 02/18/2020   Uses Spanish as primary spoken language 02/18/2020   Heartburn 02/18/2020   Single live birth 05/21/2019   LGSIL on Pap smear of cervix 10/29/2018   Recurrent epistaxis 10/31/2017   Anxiety and depression 10/10/2017   Acute anterior anal fissure 04/04/2017   Anemia 04/04/2017   Chlamydia infection 10/08/2014   Chronic constipation 10/02/2014   Adnexal tenderness 10/02/2014   Left lower quadrant pain 10/02/2014   Pain with bowel movements 10/02/2014   Generalized abdominal pain 10/02/2014   Straining during bowel movements 09/10/2013   Bulimia nervosa (HCC) 09/09/2013   Blood in stool 09/09/2013   GERD (gastroesophageal reflux disease) 09/09/2013   Constipation 09/09/2013    Past Surgical History:  Procedure Laterality Date   APPENDECTOMY     CHOLECYSTECTOMY N/A 02/19/2020   Procedure: LAPAROSCOPIC CHOLECYSTECTOMY WITH INTRAOPERATIVE CHOLANGIOGRAM;  Surgeon:  Ethyl Lenis, MD;  Location: [facility];  Service: General;  Laterality: N/A;    OB History     Gravida  2   Para      Term      Preterm      AB      Living         SAB      IAB      Ectopic      Multiple      Live Births               Home Medications    Prior to Admission medications  Medication Sig Start Date End Date Taking? Authorizing Provider  acetaminophen  (TYLENOL ) 500 MG tablet Take 2 tablets (1,000 mg total) by mouth every 6 (six) hours. 02/20/20   Tammy Sor, PA-C  cyclobenzaprine  (FLEXERIL ) 10 MG tablet Take 1 tablet (10 mg total) by mouth 2 (two) times daily as needed for muscle spasms. 06/05/24   Rising, Asberry, PA-C  ibuprofen  (ADVIL ) 800 MG tablet Take 1 tablet (800 mg total) by mouth 3 (three) times daily. 06/05/24   Rising, Asberry, PA-C  ondansetron  (ZOFRAN ) 4 MG tablet Take 4 mg by mouth every 8 (eight) hours as needed. 04/18/23   [provider]    Family History Family History  Problem Relation Age of Onset   Hyperlipidemia Father    Hypertension Father    Diabetes Father    Colon  cancer Maternal Grandmother    Esophageal cancer Neg Hx    Stomach cancer Neg Hx     Social History Social History[1]   Allergies   Hydrocodone  and Latex   Review of Systems Review of Systems  Constitutional:  Positive for chills. Negative for fever.  HENT:  Negative for congestion, ear pain, rhinorrhea and sore throat.   Respiratory:  Positive for wheezing. Negative for cough and shortness of breath.   Gastrointestinal:  Positive for diarrhea, nausea and vomiting. Negative for abdominal pain.  Musculoskeletal:  Positive for myalgias.  Skin:  Negative for rash.     Physical Exam Triage Vital Signs ED Triage Vitals  Encounter Vitals Group     BP 08/19/24 1538 109/70     Girls Systolic BP Percentile --      Girls Diastolic BP Percentile --      Boys Systolic BP Percentile --      Boys Diastolic BP Percentile --      Pulse Rate  08/19/24 1538 90     Resp 08/19/24 1538 17     Temp 08/19/24 1538 98.3 F (36.8 C)     Temp Source 08/19/24 1538 Oral     SpO2 08/19/24 1538 96 %     Weight --      Height --      Head Circumference --      Peak Flow --      Pain Score 08/19/24 1536 10     Pain Loc --      Pain Education --      Exclude from Growth Chart --    No data found.  Updated Vital Signs BP 109/70 (BP Location: Right Arm)   Pulse 90   Temp 98.3 F (36.8 C) (Oral)   Resp 17   LMP 07/03/2024 (Exact Date)   SpO2 96%   Visual Acuity Right Eye Distance:   Left Eye Distance:   Bilateral Distance:    Right Eye Near:   Left Eye Near:    Bilateral Near:     Physical Exam Vitals reviewed.  Constitutional:      General: She is awake. She is not in acute distress.    Appearance: Normal appearance. She is well-developed and well-groomed. She is ill-appearing. She is not toxic-appearing or diaphoretic.  HENT:     Head: Normocephalic and atraumatic.  Eyes:     General: Lids are normal. Gaze aligned appropriately.     Extraocular Movements: Extraocular movements intact.     Conjunctiva/sclera: Conjunctivae normal.  Cardiovascular:     Rate and Rhythm: Normal rate and regular rhythm.  Pulmonary:     Effort: Pulmonary effort is normal.     Breath sounds: Normal breath sounds. No decreased air movement. No decreased breath sounds, wheezing, rhonchi or rales.  Abdominal:     General: Abdomen is flat. Bowel sounds are normal.     Palpations: Abdomen is soft.  Musculoskeletal:     Cervical back: Normal range of motion and neck supple.  Lymphadenopathy:     Head:     Right side of head: No submental, submandibular or preauricular adenopathy.     Left side of head: No submental, submandibular or preauricular adenopathy.     Cervical:     Right cervical: No superficial cervical adenopathy.    Left cervical: No superficial cervical adenopathy.  Skin:    General: Skin is warm and dry.  Neurological:      Mental Status: She is alert  and oriented to person, place, and time.  Psychiatric:        Attention and Perception: Attention and perception normal.        Mood and Affect: Mood and affect normal.        Speech: Speech normal.        Behavior: Behavior normal. Behavior is cooperative.        Thought Content: Thought content normal.        Judgment: Judgment normal.      UC Treatments / Results  Labs (all labs ordered are listed, but only abnormal results are displayed) Labs Reviewed  POCT INFLUENZA A/B - Normal    EKG   Radiology No results found.  Procedures Procedures (including critical care time)  Medications Ordered in UC Medications  diphenhydrAMINE  (BENADRYL ) injection 50 mg (50 mg Intramuscular Given 08/19/24 1615)    Initial Impression / Assessment and Plan / UC Course  I have reviewed the triage vital signs and the nursing notes.  Pertinent labs & imaging results that were available during my care of the patient were reviewed by me and considered in my medical decision making (see chart for details).      Final Clinical Impressions(s) / UC Diagnoses   Final diagnoses:  Nausea and vomiting, unspecified vomiting type  Diarrhea of presumed infectious origin   Patient is here today with concerns of persistent nausea, vomiting, diarrhea this been ongoing for the last few days.  She reports that her nausea and vomiting symptoms are not improving with Unisom and B6 as directed by her doctor.  She and her husband state that she is unable to keep anything down for more than 5 minutes and she feels very fatigued.  She denies abdominal pain, vaginal bleeding, chest pain, difficulty breathing or coughing.  She does note some wheezing when she is more active.  Physical exam is largely reassuring and as are vitals.  Reviewed with patient that we can administer a Benadryl  injection to assist with the nausea and vomiting.  Reviewed that she should not take the Unisom if she  continues to use Benadryl  especially at home.  Patient was also provided with a list of medications that are appropriate and safe to use during pregnancy.  Recommend starting Pepcid, continuing Tums, switching to Benadryl  to assist with her current symptoms of heartburn and nausea and vomiting.  Reviewed increasing hydration efforts and implementing a bland diet until she is feeling better.  ED and return precautions reviewed and provided in AVS.  Follow-up as needed    Discharge Instructions      You were seen today for concerns of persistent nausea, vomiting and diarrhea.  To help with your symptoms we administered a shot of Benadryl  50 mg.  This should help reduce the nausea and vomiting that you are experiencing.  For the next 24 hours I recommend that you do not take the vitamin B6 and Unisom.  If you are taking Benadryl  at home please do not take the Unisom as this can lead to an overdose of antihistamine medications. I recommend implementing a bland diet for the next few days with small portions to help prevent GI upset.  Please make sure that you are increasing your liquid intake to help prevent dehydration.  We have supplied you with a list of medications that are safe to take during pregnancy.  I have highlighted a few that should help with your current symptoms. If you feel like your symptoms are getting worse, you  are unable to eat or drink due to nausea and vomiting, you feel like you are getting dehydrated, you are having chest pain, difficulty breathing, fevers that are not responding to Tylenol  and ibuprofen , loss of consciousness or confusion please go to the ER.     ED Prescriptions   None    PDMP not reviewed this encounter.     [1]  Social History Tobacco Use   Smoking status: Never   Smokeless tobacco: Never  Vaping Use   Vaping status: Never Used  Substance Use Topics   Alcohol use: No   Drug use: No     Cornelius Schuitema, [place] BRAVO, PA-C 08/19/24 1635  "

## 2024-08-19 NOTE — ED Notes (Signed)
 Pt declined translator for clinical intake. States she knows a little English. Spouse will interpret if she does not understand.
